# Patient Record
Sex: Male | Born: 1960
Health system: Southern US, Community
[De-identification: ages and names within clinical notes are randomized; demographics above are authoritative.]

## PROBLEM LIST (undated history)

## (undated) DIAGNOSIS — R519 Headache, unspecified: Secondary | ICD-10-CM

## (undated) DIAGNOSIS — I10 Essential (primary) hypertension: Secondary | ICD-10-CM

## (undated) DIAGNOSIS — E785 Hyperlipidemia, unspecified: Secondary | ICD-10-CM

## (undated) DIAGNOSIS — Z9989 Dependence on other enabling machines and devices: Secondary | ICD-10-CM

## (undated) DIAGNOSIS — G4733 Obstructive sleep apnea (adult) (pediatric): Secondary | ICD-10-CM

## (undated) DIAGNOSIS — K469 Unspecified abdominal hernia without obstruction or gangrene: Secondary | ICD-10-CM

## (undated) DIAGNOSIS — J45909 Unspecified asthma, uncomplicated: Secondary | ICD-10-CM

## (undated) DIAGNOSIS — R0600 Dyspnea, unspecified: Secondary | ICD-10-CM

## (undated) DIAGNOSIS — E669 Obesity, unspecified: Secondary | ICD-10-CM

## (undated) DIAGNOSIS — G5601 Carpal tunnel syndrome, right upper limb: Secondary | ICD-10-CM

## (undated) DIAGNOSIS — J189 Pneumonia, unspecified organism: Secondary | ICD-10-CM

## (undated) DIAGNOSIS — Z973 Presence of spectacles and contact lenses: Secondary | ICD-10-CM

## (undated) HISTORY — DX: Obstructive sleep apnea (adult) (pediatric): G47.33

## (undated) HISTORY — DX: Unspecified abdominal hernia without obstruction or gangrene: K46.9

## (undated) HISTORY — DX: Hyperlipidemia, unspecified: E78.5

## (undated) HISTORY — DX: Dependence on other enabling machines and devices: Z99.89

## (undated) HISTORY — DX: Obesity, unspecified: E66.9

## (undated) HISTORY — PX: WISDOM TOOTH EXTRACTION: SHX21

## (undated) HISTORY — DX: Pneumonia, unspecified organism: J18.9

## (undated) HISTORY — DX: Carpal tunnel syndrome, right upper limb: G56.01

## (undated) HISTORY — PX: EYE SURGERY: SHX253

---

## 1998-10-27 ENCOUNTER — Ambulatory Visit (HOSPITAL_COMMUNITY): Admission: RE | Admit: 1998-10-27 | Discharge: 1998-10-27 | Payer: Self-pay | Admitting: Internal Medicine

## 1998-12-05 ENCOUNTER — Ambulatory Visit (HOSPITAL_COMMUNITY): Admission: RE | Admit: 1998-12-05 | Discharge: 1998-12-05 | Payer: Self-pay | Admitting: Internal Medicine

## 1999-10-11 ENCOUNTER — Emergency Department (HOSPITAL_COMMUNITY): Admission: EM | Admit: 1999-10-11 | Discharge: 1999-10-11 | Payer: Self-pay | Admitting: *Deleted

## 1999-10-19 ENCOUNTER — Ambulatory Visit (HOSPITAL_COMMUNITY): Admission: RE | Admit: 1999-10-19 | Discharge: 1999-10-19 | Payer: Self-pay | Admitting: Surgery

## 2001-08-30 ENCOUNTER — Inpatient Hospital Stay (HOSPITAL_COMMUNITY): Admission: EM | Admit: 2001-08-30 | Discharge: 2001-09-01 | Payer: Self-pay | Admitting: Emergency Medicine

## 2002-11-19 HISTORY — PX: HERNIA REPAIR: SHX51

## 2003-11-20 HISTORY — PX: APPENDECTOMY: SHX54

## 2005-05-29 ENCOUNTER — Emergency Department (HOSPITAL_COMMUNITY): Admission: EM | Admit: 2005-05-29 | Discharge: 2005-05-30 | Payer: Self-pay | Admitting: Emergency Medicine

## 2006-04-26 ENCOUNTER — Emergency Department (HOSPITAL_COMMUNITY): Admission: EM | Admit: 2006-04-26 | Discharge: 2006-04-26 | Payer: Self-pay | Admitting: Family Medicine

## 2012-08-24 ENCOUNTER — Emergency Department (HOSPITAL_COMMUNITY)
Admission: EM | Admit: 2012-08-24 | Discharge: 2012-08-24 | Disposition: A | Discharge status: Home / Self Care | Payer: 59 | Source: Home / Self Care | Attending: Emergency Medicine | Admitting: Emergency Medicine

## 2012-08-24 ENCOUNTER — Encounter (HOSPITAL_COMMUNITY): Payer: Self-pay

## 2012-08-24 DIAGNOSIS — J0100 Acute maxillary sinusitis, unspecified: Secondary | ICD-10-CM

## 2012-08-24 HISTORY — DX: Essential (primary) hypertension: I10

## 2012-08-24 MED ORDER — FEXOFENADINE-PSEUDOEPHED ER 60-120 MG PO TB12: 1.0000 | ORAL_TABLET | Freq: Two times a day (BID) | ORAL | Status: AC

## 2012-08-24 MED ORDER — FEXOFENADINE-PSEUDOEPHED ER 60-120 MG PO TB12: 1.0000 | ORAL_TABLET | Freq: Two times a day (BID) | ORAL | Status: DC

## 2012-08-24 MED ORDER — AMOXICILLIN-POT CLAVULANATE 500-125 MG PO TABS: 1.0000 | ORAL_TABLET | Freq: Three times a day (TID) | ORAL | Status: DC

## 2012-08-24 NOTE — ED Provider Notes (Signed)
History     CSN: [id number]  Arrival date & time 08/24/12  0945   First MD Initiated Contact with Patient 08/24/12 [phone]      Chief Complaint  Patient presents with  . Facial Pain    (Consider location/radiation/quality/duration/timing/severity/associated sxs/prior treatment) HPI Comments: Patient presents urgent care this won't complaining of worsening sinus pressure to both maxillary regions. For approximately 6-7 days. He describes postnasal dripping and ear discomfort mainly on the right ear. He's been having a congested nose been trying with some Allegra from his wife with no improvement. Patient denies any fevers, cough body aches, or shortness of breath. Describes that at night dripping behind his throat wakes him up. He has had sinus infections before usually don't last as long.  The history is provided by the patient.    Past Medical History  Diagnosis Date  . Hypertension     History reviewed. No pertinent past surgical history.  No family history on file.  History  Substance Use Topics  . Smoking status: Not on file  . Smokeless tobacco: Not on file  . Alcohol Use: No      Review of Systems  Constitutional: Negative for fever, chills, activity change and appetite change.  HENT: Positive for congestion, sore throat, rhinorrhea, postnasal drip and sinus pressure. Negative for hearing loss, ear pain, sneezing, trouble swallowing, neck pain and voice change.   Respiratory: Negative for cough and shortness of breath.   Cardiovascular: Negative for chest pain.  Gastrointestinal: Negative for abdominal pain.  Genitourinary: Negative for difficulty urinating.  Skin: Negative for rash.  Neurological: Negative for dizziness, numbness and headaches.    Allergies  Review of patient's allergies indicates no known allergies.  Home Medications   Current Outpatient Rx  Name Route Sig Dispense Refill  . LISINOPRIL 20 MG PO TABS Oral Take 20 mg by mouth daily.    .  AMOXICILLIN-POT CLAVULANATE 500-125 MG PO TABS Oral Take 1 tablet (500 mg total) by mouth every 8 (eight) hours. 30 tablet 0  . FEXOFENADINE-PSEUDOEPHED ER 60-120 MG PO TB12 Oral Take 1 tablet by mouth every 12 (twelve) hours. 15 tablet 0    BP 140/90  Pulse 77  Temp 99.1 F (37.3 C) (Oral)  Resp 19  SpO2 93%  Physical Exam  Nursing note and vitals reviewed. Constitutional: He appears well-developed and well-nourished.  HENT:  Head: Normocephalic.  Right Ear: Tympanic membrane normal.  Left Ear: Tympanic membrane normal.  Nose: Right sinus exhibits maxillary sinus tenderness. Left sinus exhibits maxillary sinus tenderness.  Mouth/Throat: Uvula is midline. Posterior oropharyngeal erythema present. No oropharyngeal exudate.  Eyes: Conjunctivae normal are normal.  Neck: Neck supple. No JVD present.  Cardiovascular: Normal rate.   No murmur heard. Pulmonary/Chest: Effort normal and breath sounds normal.  Lymphadenopathy:    He has no cervical adenopathy.  Skin: No erythema.    ED Course  Procedures (including critical care time)  Labs Reviewed - No data to display No results found.   1. Sinusitis, acute maxillary       MDM  Maxillary sinusitis worsening. Have encouraged patient to take Allegra-D for 7 days (to monitor his blood pressure closely) increasing to stop Allegra-D and stick to plain Allegra. Also encouraged to start with Augmentin. And other measures to keep his upper airways hydrated. To increase his oral intake of fluids along with a humidifier use at night. Patient agrees with treatment plan followup care as necessary.        Freada Bergeron  Diana Davenport, MD 08/24/12 1026

## 2012-08-24 NOTE — ED Notes (Signed)
Sinus pain , pressure x 5 days

## 2012-11-01 ENCOUNTER — Emergency Department (HOSPITAL_COMMUNITY)
Admission: EM | Admit: 2012-11-01 | Discharge: 2012-11-01 | Disposition: A | Discharge status: Nursing Home (Includes ICF) | Payer: 59 | Source: Home / Self Care | Attending: Family Medicine | Admitting: Family Medicine

## 2012-11-01 ENCOUNTER — Emergency Department (INDEPENDENT_AMBULATORY_CARE_PROVIDER_SITE_OTHER): Payer: 59

## 2012-11-01 ENCOUNTER — Encounter (HOSPITAL_COMMUNITY): Payer: Self-pay | Admitting: Emergency Medicine

## 2012-11-01 ENCOUNTER — Encounter (HOSPITAL_COMMUNITY): Payer: Self-pay | Admitting: *Deleted

## 2012-11-01 ENCOUNTER — Emergency Department (HOSPITAL_COMMUNITY)
Admission: EM | Admit: 2012-11-01 | Discharge: 2012-11-01 | Disposition: A | Discharge status: Home Health | Payer: 59 | Attending: Emergency Medicine | Admitting: Emergency Medicine

## 2012-11-01 DIAGNOSIS — I10 Essential (primary) hypertension: Secondary | ICD-10-CM | POA: Insufficient documentation

## 2012-11-01 DIAGNOSIS — R0602 Shortness of breath: Secondary | ICD-10-CM

## 2012-11-01 DIAGNOSIS — R079 Chest pain, unspecified: Secondary | ICD-10-CM

## 2012-11-01 DIAGNOSIS — Z79899 Other long term (current) drug therapy: Secondary | ICD-10-CM | POA: Insufficient documentation

## 2012-11-01 DIAGNOSIS — J159 Unspecified bacterial pneumonia: Secondary | ICD-10-CM | POA: Insufficient documentation

## 2012-11-01 DIAGNOSIS — R062 Wheezing: Secondary | ICD-10-CM | POA: Insufficient documentation

## 2012-11-01 DIAGNOSIS — J189 Pneumonia, unspecified organism: Secondary | ICD-10-CM

## 2012-11-01 MED ORDER — MOXIFLOXACIN HCL 400 MG PO TABS: 400.0000 mg | ORAL_TABLET | Freq: Every day | ORAL | Status: DC

## 2012-11-01 MED ORDER — ALBUTEROL SULFATE HFA 108 (90 BASE) MCG/ACT IN AERS
2.0000 | INHALATION_SPRAY | RESPIRATORY_TRACT | Status: DC | PRN
  Administered 2012-11-01: 2 via RESPIRATORY_TRACT
  Filled 2012-11-01: qty 6.7

## 2012-11-01 NOTE — ED Notes (Signed)
Pt sent here from [facility]. Having productive cough and wheezing x 3 weeks, has been taking amoxicillin for sinus infection. Xray showed pneumonia and ucc reported possible ekg changes, transported here by [facility].

## 2012-11-01 NOTE — ED Notes (Signed)
Pt discharged to home with family. NAD.  

## 2012-11-01 NOTE — ED Notes (Signed)
Transport via [facility] arranged

## 2012-11-01 NOTE — ED Notes (Addendum)
Pt c/o productive cough with yellow sputum and wheezing x 3 wks. Pt states that he has felt feverish but never checked temp and some feeling of fatigue. Recent dx of sinus infection is currently on antibiotic. Pt denies n/v/d  Expiratory wheeze is noted.

## 2012-11-01 NOTE — ED Provider Notes (Signed)
History     CSN: [id number]  Arrival date & time 11/01/12  1336   First MD Initiated Contact with Patient 11/01/12 1517      Chief Complaint  Patient presents with  . Cough  . Pneumonia    (Consider location/radiation/quality/duration/timing/severity/associated sxs/prior treatment) HPI Pt with recent history of persistent cough for the last several weeks, has had two different courses of antibiotics with some relief but then returns. He has had some soreness in his chest related to cough but none otherwise. He reports a single episode of dyspnea after running up the stairs at [facility] resolved after coughing up some phlegm. He was seen at [facility] and had CXR showing a pneumonia however he was sent to the ED for evaluation of an 'abnormal EKG'.   Past Medical History  Diagnosis Date  . Hypertension     History reviewed. No pertinent past surgical history.  Family History  Problem Relation Age of Onset  . Diabetes Other   . Hypertension Other   . Thyroid disease Other     History  Substance Use Topics  . Smoking status: Never Smoker   . Smokeless tobacco: Not on file  . Alcohol Use: No      Review of Systems All other systems reviewed and are negative except as noted in HPI.   Allergies  Review of patient's allergies indicates no known allergies.  Home Medications   Current Outpatient Rx  Name  Route  Sig  Dispense  Refill  . ASPIRIN-ACETAMINOPHEN 500-325 MG PO PACK   Oral   Take 1 packet by mouth every 6 (six) hours as needed. For pain         . CEFDINIR 300 MG PO CAPS   Oral   Take 300 mg by mouth 2 (two) times daily.         . B-12 PO   Oral   Take 1 tablet by mouth daily.         . IBUPROFEN 200 MG PO TABS   Oral   Take 400 mg by mouth every 6 (six) hours as needed. For pain         . LOSARTAN POTASSIUM-HCTZ 50-12.5 MG PO TABS   Oral   Take 1 tablet by mouth daily.         . MULTI-VITAMIN/MINERALS PO TABS   Oral   Take 1 tablet by mouth  daily.           BP 136/85  Pulse 88  Temp 98 F (36.7 C) (Oral)  Resp 20  SpO2 98%  Physical Exam  Nursing note and vitals reviewed. Constitutional: He is oriented to person, place, and time. He appears well-developed and well-nourished.  HENT:  Head: Normocephalic and atraumatic.  Eyes: EOM are normal. Pupils are equal, round, and reactive to light.  Neck: Normal range of motion. Neck supple.  Cardiovascular: Normal rate, normal heart sounds and intact distal pulses.   Pulmonary/Chest: Effort normal and breath sounds normal. He exhibits tenderness.  Abdominal: Bowel sounds are normal. He exhibits no distension. There is no tenderness.  Musculoskeletal: Normal range of motion. He exhibits no edema and no tenderness.  Neurological: He is alert and oriented to person, place, and time. He has normal strength. No cranial nerve deficit or sensory deficit.  Skin: Skin is warm and dry. No rash noted.  Psychiatric: He has a normal mood and affect.    ED Course  Procedures (including critical care time)  Labs Reviewed -  No data to display Dg Chest 2 View  11/01/2012  *RADIOLOGY REPORT*  Clinical Data: Productive cough.  Shortness of breath.  CHEST - 2 VIEW  Comparison: None.  Findings: Mild infiltrate is seen in the posterior left lower lobe, consistent with pneumonia.  Right lung is clear.  No evidence of pleural effusion.  Heart size is normal.  No hilar or mediastinal masses are identified.  IMPRESSION: Posterior left lower lobe infiltrate, consistent with pneumonia. Post-treatment  radiographic followup recommended to confirm resolution.   Original Report Authenticated By: Myles Rosenthal, M.D.      No diagnosis found.    MDM   Date: 11/01/2012  Rate: 82  Rhythm: normal sinus rhythm  QRS Axis: normal  Intervals: normal  ST/T Wave abnormalities: normal  Conduction Disutrbances:none  Narrative Interpretation:   Old EKG Reviewed: unchanged  EKG changes mentioned in the note  from [facility] are not apparent on the ED EKG which is normal. Pt's symptoms are not concerning for ACS or PE. Will d/c Omnicef and discharge with Avelox Rx and albuterol HFA         Charles B. Bernette Mayers, MD 11/01/12 1537

## 2012-11-01 NOTE — ED Provider Notes (Signed)
History     CSN: [id number]  Arrival date & time 11/01/12  1116   First MD Initiated Contact with Patient 11/01/12 1146      Chief Complaint  Patient presents with  . Cough    productive cough. wheezing    (Consider location/radiation/quality/duration/timing/severity/associated sxs/prior treatment) HPI Comments: 51 year old male with history of obesity and hypertension. Here complaining of central chest discomfort -like pressure for 2 days. Symptoms associated with productive cough with white yellow sputum, nasal congestion, general malaise and shortness of breath and wheezing progressively worse during the last 3 weeks. He has been on antibiotics twice for upper respiratory infection and is currently on Augmentin day #3. Patient also reports paroxysmal nocturnal dyspnea and shortness of breath with going up few flair of stairs dyspnea for him during the last week. Denies fever. No nausea vomiting or diarrhea. No headaches or dizziness.   Past Medical History  Diagnosis Date  . Hypertension     History reviewed. No pertinent past surgical history.  Family History  Problem Relation Age of Onset  . Diabetes Other   . Hypertension Other   . Thyroid disease Other     History  Substance Use Topics  . Smoking status: Never Smoker   . Smokeless tobacco: Not on file  . Alcohol Use: No      Review of Systems  Constitutional: Positive for fatigue. Negative for fever and chills.  HENT: Positive for congestion.   Respiratory: Positive for cough, chest tightness, shortness of breath and wheezing.   Cardiovascular: Positive for chest pain. Negative for leg swelling.  Gastrointestinal: Negative for nausea, vomiting, abdominal pain and diarrhea.  Musculoskeletal: Negative for back pain.  Skin: Negative for rash.  Neurological: Negative for dizziness and headaches.    Allergies  Review of patient's allergies indicates no known allergies.  Home Medications   Current  Outpatient Rx  Name  Route  Sig  Dispense  Refill  . AMOXICILLIN-POT CLAVULANATE 500-125 MG PO TABS   Oral   Take 1 tablet (500 mg total) by mouth every 8 (eight) hours.   30 tablet   0   . LISINOPRIL 20 MG PO TABS   Oral   Take 20 mg by mouth daily.           BP 153/105  Pulse 89  Temp 99 F (37.2 C) (Oral)  Resp 19  SpO2 96%  Physical Exam  Nursing note and vitals reviewed. Constitutional: He is oriented to person, place, and time. He appears well-developed and well-nourished.       Ill appearing.  HENT:  Head: Normocephalic and atraumatic.       Nasal Congestion with erythema of nasal turbinates, no rhinorrhea. Pharyngeal erythema no exudates. No uvula deviation. No trismus. TM's with increased vascular markings and some dullness more on the left side, no swelling or bulging   Eyes: Conjunctivae normal are normal. Pupils are equal, round, and reactive to light. Right eye exhibits no discharge. Left eye exhibits no discharge. No scleral icterus.  Neck: Neck supple. No JVD present. No thyromegaly present.  Cardiovascular: Normal rate, regular rhythm and normal heart sounds.  Exam reveals no gallop and no friction rub.        Trace nonpitting bilateral lower extremity edema  Pulmonary/Chest:       Decreased breath sounds in bilateral bases worse on the left side also associated with rales. No tachypnea. No retractions. No rhonchi or wheezing.  Lymphadenopathy:    He has no cervical  adenopathy.  Neurological: He is alert and oriented to person, place, and time.  Skin: No rash noted. He is not diaphoretic.    ED Course  Procedures (including critical care time)  Labs Reviewed - No data to display Dg Chest 2 View  11/01/2012  *RADIOLOGY REPORT*  Clinical Data: Productive cough.  Shortness of breath.  CHEST - 2 VIEW  Comparison: None.  Findings: Mild infiltrate is seen in the posterior left lower lobe, consistent with pneumonia.  Right lung is clear.  No evidence of  pleural effusion.  Heart size is normal.  No hilar or mediastinal masses are identified.  IMPRESSION: Posterior left lower lobe infiltrate, consistent with pneumonia. Post-treatment  radiographic followup recommended to confirm resolution.   Original Report Authenticated By: Myles Rosenthal, M.D.      1. Left lower lobe pneumonia   2. Chest pain   3. Shortness of breath     EKG: With sinus rhythm and ventricular rate 73 beats per minutes. Impress ST depression in V1 with inverted T-waves in V1 V2 or V3.     MDM  51 year old male with history of obesity and hypertension. Here complaining of central chest pressure type discomfort for 2 days. Has recently been on 2 different antibiotics for upper respiratory infection. Reports paroxysmal nocturnal dyspnea, and worsening shortness of breath on going up stairs. On exam: Decrease breath sounds and bilateral crackles worse on the left side. O2 sat 93%. Nonpitting trace edema. X-rays shows left Lower lobe pneumonia. Patient EKG findings and clinical findings and history concerning for acute coronary artery disease or developing CHF possibly concomitant with pneumonia. Decided to transfer to the emergency department for further evaluation and management patient has been transferred via [facility].         Sharin Grave, MD 11/01/12 1324

## 2012-12-10 ENCOUNTER — Encounter: Payer: Self-pay | Admitting: *Deleted

## 2012-12-11 ENCOUNTER — Encounter: Payer: Self-pay | Admitting: Internal Medicine

## 2012-12-11 ENCOUNTER — Ambulatory Visit (INDEPENDENT_AMBULATORY_CARE_PROVIDER_SITE_OTHER): Payer: 59 | Admitting: Internal Medicine

## 2012-12-11 VITALS — BP 130/86 | HR 75 | Temp 98.6°F | Ht 72.0 in | Wt 343.2 lb

## 2012-12-11 DIAGNOSIS — R0602 Shortness of breath: Secondary | ICD-10-CM

## 2012-12-11 DIAGNOSIS — R0609 Other forms of dyspnea: Secondary | ICD-10-CM

## 2012-12-11 DIAGNOSIS — R0683 Snoring: Secondary | ICD-10-CM

## 2012-12-11 DIAGNOSIS — R0600 Dyspnea, unspecified: Secondary | ICD-10-CM

## 2012-12-11 DIAGNOSIS — Z6841 Body Mass Index (BMI) 40.0 and over, adult: Secondary | ICD-10-CM

## 2012-12-11 NOTE — Assessment & Plan Note (Signed)
  Weight Mgmt  - Please look at [facility] low glycemic lipid diet sheet - Please take to foods in the left lane. Which is to eat only healthy foods and avoid unhealthy foods. Learning what is healthy and what is unhealthy is key  - Principal is to avoid foods in the middle and the right lines which are basically sugary fruits, canned beans, all types of soda including diet soda, sugary foods, all bread and all pasta. Load up heavily on green vegetables and lean meat avoiding fried foods at all times - Discipline is key now and forever. Will power is the weakest when you're tired of hungry especially at night. Keep yourself full by snacking on healthy foods - Ideal snacks or nuts, hummus, weight protein powder, low-fat plain yogurt or light yogurt but in limited quantities, 1-2 servings of low glycemic fruits

## 2012-12-11 NOTE — Assessment & Plan Note (Signed)
Refer sleep medicine

## 2012-12-11 NOTE — Assessment & Plan Note (Signed)
Shortness of breath Shortness of breath was likely due to pneumonia and related deconditioning after that. With exercise you have improved and back to normal In the absence of any respiratory symptoms there is no indication to do any pulmonary function test especially to walk test on room air is normal However if he were to develop any respiratory symptoms including wheezing chest tightness or cough or shortness of breath you would need evaluation

## 2012-12-11 NOTE — Patient Instructions (Addendum)
Shortness of breath Shortness of breath was likely due to pneumonia and related deconditioning after that. With exercise you have improved and back to normal In the absence of any respiratory symptoms there is no indication to do any pulmonary function test especially to walk test on room air is normal However if he were to develop any respiratory symptoms including wheezing chest tightness or cough or shortness of breath you would need evaluation  Weight Mgmt  - Please look at [facility] low glycemic lipid diet sheet - Please take to foods in the left lane. Which is to eat only healthy foods and avoid unhealthy foods. Learning what is healthy and what is unhealthy is key  - Principal is to avoid foods in the middle and the right lines which are basically sugary fruits, canned beans, all types of soda including diet soda, sugary foods, all bread and all pasta. Load up heavily on green vegetables and lean meat avoiding fried foods at all times - Discipline is key now and forever. Will power is the weakest when you're tired of hungry especially at night. Keep yourself full by snacking on healthy foods - Ideal snacks or nuts, hummus, weight protein powder, low-fat plain yogurt or light yogurt but in limited quantities, 1-2 servings of low glycemic fruits  Sleep evaluation  - Please make an appointment to see one of the sleep specialist in our office  Followup in - Sleep Dr. Marland Kitchen No further followup with me unless respiratory problems arise

## 2012-12-11 NOTE — Progress Notes (Signed)
Subjective:    Patient ID: Jake Martin, male    DOB: 17-Oct-1961, 52 y.o.   MRN: [id number]  HPI PCP is Hoyle Sauer, MD  Body mass index is 46.55 kg/(m^2). History  Smoking status  . Never Smoker   Smokeless tobacco  . Not on file     IOV 12/11/2012   This is a 52 year old morbidly obese, nonsmoking male. He currently weighs around 330 pounds. A few years ago he weighed less than 300 pounds but 18 months ago weight 360 pounds. Since the last 18 months he has been working on a treadmill and this gradually lost 30 pounds of weight. For his routine treadmill exercise and daily life his never had any respiratory complaints or any chest complaints whatsoever. Then in the fall and early winter of 2013 he picked up some congestion in his left chest with symptoms of cough and yellow sputum, focal wheezing but no fever. This resulted ultimately in an emergency room visit on 11/01/2012 when chest x-ray showed left lower lobe infiltrates. He was discharged on sunken and antibiotic not otherwise specified. Of note I personally review the section confirmed the findings. After that if began to feel dramatically better and his respiratory symptoms resolved. However all the symptoms resulted in him not doing his routine treadmill exercise. Then in January 2014 because he was feeling better he started back on treadmill and started noticing dyspnea with exertion and fatigue. He reported this to his primary care physician who did a repeat chest x-ray and per patient and review of primary-care physician notes the chest x-ray showed resolution of pneumonia [I do not have personally review of this chest x-ray image]. Given his morbid obesity and rest recent and his primary care physician was concerned enough that he refer patient to t pulmonary. She was also given samples of dulera he used for a few days and did find improvement with symptoms. After that he stopped using the inhaler but never had any  recurrence of symptoms. In the time between making the appointment and today he continues to do well without any symptoms and feels back to baseline. He does not feel that he needs a pulmonary function test. Walking him in the office 185 feet x3 laps he did not desaturate or get tachycardic. From a respiratory and cardiac standpoint he is completely asymptomatic  His main concern is  1) sleep apnea. He wants to be evaluated for this 2) morbid obesity: He is aware of all the risk factors that this brings. He wants to lose weight. We did discuss some dietary strategies of low bad-carbohydrate, high fiber rich food with natural vegetables and lean meat diet advantages    CXR 11/01/12  - RADIOLOGY REPORT*  Clinical Data: Productive cough. Shortness of breath.  CHEST - 2 VIEW  Comparison: None.  Findings: Mild infiltrate is seen in the posterior left lower lobe,  consistent with pneumonia. Right lung is clear. No evidence of  pleural effusion. Heart size is normal. No hilar or mediastinal  masses are identified.  IMPRESSION:  Posterior left lower lobe infiltrate, consistent with pneumonia.  Post-treatment radiographic followup recommended to confirm  resolution.  Original Report Authenticated By: Myles Rosenthal, M.D.    Past Medical History  Diagnosis Date  . Hypertension   . Hernia   . Obesity   . Pneumonia      Family History  Problem Relation Age of Onset  . Diabetes Other   . Hypertension Other   . Thyroid disease  Other   . COPD Father      History   Social History  . Marital Status: Married    Spouse Name: N/A    Number of Children: N/A  . Years of Education: N/A   Occupational History  . Not on file.   Social History Main Topics  . Smoking status: Never Smoker   . Smokeless tobacco: Not on file  . Alcohol Use: No  . Drug Use: No  . Sexually Active:    Other Topics Concern  . Not on file   Social History Narrative  . No narrative on file     No Known  Allergies   Outpatient Prescriptions Prior to Visit  Medication Sig Dispense Refill  . Aspirin-Acetaminophen (GOODY BODY PAIN) 500-325 MG PACK Take 1 packet by mouth every 6 (six) hours as needed. For pain      . Cyanocobalamin (B-12 PO) Take 1 tablet by mouth daily.      Marland Kitchen ibuprofen (ADVIL,MOTRIN) 200 MG tablet Take 400 mg by mouth every 6 (six) hours as needed. For pain      . losartan-hydrochlorothiazide (HYZAAR) 50-12.5 MG per tablet Take 1 tablet by mouth daily.      . Multiple Vitamins-Minerals (MULTIVITAMIN WITH MINERALS) tablet Take 1 tablet by mouth daily.      . [DISCONTINUED] cefdinir (OMNICEF) 300 MG capsule Take 300 mg by mouth 2 (two) times daily.      . [DISCONTINUED] moxifloxacin (AVELOX) 400 MG tablet Take 1 tablet (400 mg total) by mouth daily.  7 tablet  0   Last reviewed on 12/11/2012  4:26 PM by Darrell Jewel, CMA     Review of Systems  Constitutional: Negative for fever and unexpected weight change.  HENT: Negative for ear pain, nosebleeds, congestion, sore throat, rhinorrhea, sneezing, trouble swallowing, dental problem, postnasal drip and sinus pressure.   Eyes: Negative for redness and itching.  Respiratory: Positive for shortness of breath. Negative for cough, chest tightness and wheezing.   Cardiovascular: Negative for palpitations and leg swelling.  Gastrointestinal: Negative for nausea and vomiting.  Genitourinary: Negative for dysuria.  Musculoskeletal: Negative for joint swelling.  Skin: Negative for rash.  Neurological: Negative for headaches.  Hematological: Does not bruise/bleed easily.  Psychiatric/Behavioral: Negative for dysphoric mood. The patient is not nervous/anxious.        Objective:   Physical Exam  Nursing note and vitals reviewed. Constitutional: He is oriented to person, place, and time. He appears well-developed and well-nourished. No distress.       Body mass index is 46.55 kg/(m^2).   HENT:  Head: Normocephalic and  atraumatic.  Right Ear: External ear normal.  Left Ear: External ear normal.  Mouth/Throat: Oropharynx is clear and moist. No oropharyngeal exudate.  Eyes: Conjunctivae normal and EOM are normal. Pupils are equal, round, and reactive to light. Right eye exhibits no discharge. Left eye exhibits no discharge. No scleral icterus.  Neck: Normal range of motion. Neck supple. No JVD present. No tracheal deviation present. No thyromegaly present.  Cardiovascular: Normal rate, regular rhythm and intact distal pulses.  Exam reveals no gallop and no friction rub.   No murmur heard. Pulmonary/Chest: Effort normal and breath sounds normal. No respiratory distress. He has no wheezes. He has no rales. He exhibits no tenderness.  Abdominal: Soft. Bowel sounds are normal. He exhibits no distension and no mass. There is no tenderness. There is no rebound and no guarding.  Musculoskeletal: Normal range of motion. He exhibits no  edema and no tenderness.  Lymphadenopathy:    He has no cervical adenopathy.  Neurological: He is alert and oriented to person, place, and time. He has normal reflexes. No cranial nerve deficit. Coordination normal.  Skin: Skin is warm and dry. No rash noted. He is not diaphoretic. No erythema. No pallor.  Psychiatric: He has a normal mood and affect. His behavior is normal. Judgment and thought content normal.          Assessment & Plan:

## 2012-12-30 ENCOUNTER — Institutional Professional Consult (permissible substitution): Payer: 59 | Admitting: Pulmonary Disease

## 2014-09-02 ENCOUNTER — Encounter: Payer: Self-pay | Admitting: *Deleted

## 2014-09-09 ENCOUNTER — Ambulatory Visit (INDEPENDENT_AMBULATORY_CARE_PROVIDER_SITE_OTHER): Payer: 59 | Admitting: Neurology

## 2014-09-09 ENCOUNTER — Encounter: Payer: Self-pay | Admitting: Neurology

## 2014-09-09 VITALS — BP 153/97 | HR 62 | Temp 97.1°F | Ht 72.0 in | Wt 330.0 lb

## 2014-09-09 DIAGNOSIS — R351 Nocturia: Secondary | ICD-10-CM

## 2014-09-09 DIAGNOSIS — R0683 Snoring: Secondary | ICD-10-CM

## 2014-09-09 DIAGNOSIS — G4733 Obstructive sleep apnea (adult) (pediatric): Secondary | ICD-10-CM

## 2014-09-09 DIAGNOSIS — G4719 Other hypersomnia: Secondary | ICD-10-CM

## 2014-09-09 NOTE — Patient Instructions (Signed)

## 2014-09-09 NOTE — Progress Notes (Signed)
Subjective:    Patient ID: Jake Martin is a 53 y.o. male.  HPI    Huston Foley, MD, PhD [facility] [address], [address] [address] [place], [place] [address]  Dear Dr. Felipa Eth,  I saw your patient, Jake Martin, upon your kind request in my neurologic clinic today for initial consultation of his sleep disorder, in particular, concern for underlying obstructive sleep apnea. The patient is unaccompanied today. As you know, Jake Martin is a 53 year old right-handed gentleman with an underlying medical history of morbid obesity, umbilical hernia, hypertension, carpal tunnel syndrome, hyperlipidemia and pneumonia in 2013, who reports snoring and witnessed apneas per wife as well as daytime somnolence. His ESS is 10-12. He endorses stress and recent weight gain. He does not nap. He denies morning headaches or RLS symptoms. Snoring is loud. He has to go to the bathroom once per night, usually when his dog wakes him up in the early morning hours. He is not known to kick in his sleep. He denies nocturnal acid reflux symptoms. He does have nasal congestion which seems to be seasonal and occasionally has postnasal drip.  He goes to bed around midnight and typically falls asleep okay. He wakes up once per night on average and goes to the bathroom once per night on average. He estimates that he gets about 5-6 hours of sleep per night. He does wake up rested but has daytime tiredness as the day progresses. He is a Surveyor, [employer]. Rarely will he turned on his sides. He does not sleep on his back. He drinks a lot of caffeine, approximately 10 caffeine containing beverages per day including 6 cups of coffee and unsweet tea. He does not drink very much water at all. He may have a family history of obstructive sleep apnea in his mother.  He denies cataplexy, sleep paralysis, hypnagogic or hypnopompic hallucinations, or sleep attacks. He does not report any vivid dreams,  nightmares, dream enactments, or parasomnias, such as sleep talking or sleep walking. The patient has not had a sleep study or a home sleep test.  He does not smoke. He drinks alcohol approximately 3-5 glasses of wine per week, sometimes in the form of beer in the summer time.  His Past Medical History Is Significant For: Past Medical History  Diagnosis Date  . Hypertension   . Hernia   . Obesity   . Pneumonia   . Hyperlipidemia   . Umbilical hernia   . Carpal tunnel syndrome, right     His Past Surgical History Is Significant For: Past Surgical History  Procedure Laterality Date  . Hernia repair  2004  . Eye surgery  [phone]  . Appendectomy  2005    His Family History Is Significant For: Family History  Problem Relation Age of Onset  . Diabetes Other   . Thyroid disease Other   . Hypertension Sister   . COPD Father   . Hypertension Father   . Diabetes type II      GRANDFATHER  . Coronary artery disease      GRANDMOTHER  . Aneurysm Mother     BRAIN  . Diabetes Paternal Grandmother   . Diabetes Son     His Social History Is Significant For: History   Social History  . Marital Status: Married    Spouse Name: [name]    Number of Children: 2  . Years of Education: 15   Occupational History  .      [employer]   Social History Main Topics  . Smoking status: Never Smoker   . Smokeless tobacco: Never Used  . Alcohol Use: Yes     Comment: wine 3-5 glasses weekly  . Drug Use: No  . Sexual Activity: None   Other Topics Concern  . None   Social History Narrative   Right handed and consumes 3-5 cups of caffeine daily    His Allergies Are:  No Known Allergies:   His Current Medications Are:  Outpatient Encounter Prescriptions as of 09/09/2014  Medication Sig  . albuterol (PROVENTIL HFA;VENTOLIN HFA) 108 (90 BASE) MCG/ACT inhaler Inhale 2 puffs into the lungs every 4 (four) hours as needed for wheezing or shortness of breath (2 PUFFS EVERY 4 TO 6  HOURS AS NEEDED FOR COUGH).  . Aspirin-Acetaminophen (GOODY BODY PAIN) 500-325 MG PACK Take 1 packet by mouth every 6 (six) hours as needed. For pain  . Cyanocobalamin (B-12 PO) Take 1 tablet by mouth daily.  Marland Kitchen. ibuprofen (ADVIL,MOTRIN) 200 MG tablet Take 400 mg by mouth every 6 (six) hours as needed. For pain  . irbesartan-hydrochlorothiazide (AVALIDE) 150-12.5 MG per tablet Take 1 tablet by mouth daily.  . mometasone-formoterol (DULERA) 100-5 MCG/ACT AERO Inhale 2 puffs into the lungs 2 (two) times daily.  . Multiple Vitamins-Minerals (MULTIVITAMIN WITH MINERALS) tablet Take 1 tablet by mouth daily.  . pravastatin (PRAVACHOL) 40 MG tablet Take 40 mg by mouth daily.  . [DISCONTINUED] losartan-hydrochlorothiazide (HYZAAR) 50-12.5 MG per tablet Take 1 tablet by mouth daily.  :  Review of Systems:  Out of a complete 14 point review of systems, all are reviewed and negative with the exception of these symptoms as listed below:   Review of Systems  Constitutional:       Weight gain  Respiratory: Positive for cough and wheezing.        Snoring  Psychiatric/Behavioral:       Decreased energy, change in appetite    Objective:  Neurologic Exam  Physical Exam Physical Examination:   Filed Vitals:   09/09/14 1026  BP: 153/97  Pulse: 62  Temp: 97.1 F (36.2 C)    General Examination: The patient is a very pleasant 53 y.o. male in no acute distress. He appears well-developed and well-nourished and well groomed. He is obese.  HEENT: Normocephalic, atraumatic, pupils are equal, round and reactive to light and accommodation. Funduscopic exam is normal with sharp disc margins noted. Extraocular tracking is good without limitation to gaze excursion or nystagmus noted. Normal smooth pursuit is noted. Hearing is grossly intact. Tympanic membranes are clear bilaterally. Face is symmetric with normal facial animation and normal facial sensation. Speech is clear with no dysarthria noted. There is no  hypophonia. There is no lip, neck/head, jaw or voice tremor. Neck is supple with full range of passive and active motion. There are no carotid bruits on auscultation. Oropharynx exam reveals: mild mouth dryness, good dental hygiene and marked airway crowding, due to thick soft palate, redundant soft palate, larger uvula, thicker tongue, and tonsils are 2+ bilaterally. Mallampati is class III. Tongue protrudes centrally and palate elevates symmetrically. Neck size is 18.5 inches. He has a Absent overbite. Nasal inspection reveals no significant nasal mucosal bogginess or redness and no septal deviation.   Chest: Clear to auscultation without wheezing, rhonchi or crackles noted.  Heart: S1+S2+0, regular and normal without murmurs, rubs or gallops noted.   Abdomen: Soft, non-tender and non-distended with normal bowel sounds appreciated on auscultation.  Extremities:  There is no pitting edema in the distal lower extremities bilaterally. Pedal pulses are intact.  Skin: Warm and dry without trophic changes noted. There are no varicose veins, but he has spider veins and chronic appearing mild discoloration in the distal lower extremities bilaterally.  Musculoskeletal: exam reveals no obvious joint deformities, tenderness or joint swelling or erythema.   Neurologically:  Mental status: The patient is awake, alert and oriented in all 4 spheres. His immediate and remote memory, attention, language skills and fund of knowledge are appropriate. There is no evidence of aphasia, agnosia, apraxia or anomia. Speech is clear with normal prosody and enunciation. Thought process is linear. Mood is normal and affect is normal.  Cranial nerves II - XII are as described above under HEENT exam. In addition: shoulder shrug is normal with equal shoulder height noted. Motor exam: Normal bulk, strength and tone is noted. There is no drift, tremor or rebound. Romberg is negative. Reflexes are 2+ throughout. Babinski: Toes are  flexor bilaterally. Fine motor skills and coordination: intact with normal finger taps, normal hand movements, normal rapid alternating patting, normal foot taps and normal foot agility.  Cerebellar testing: No dysmetria or intention tremor on finger to nose testing. Heel to shin is unremarkable bilaterally. There is no truncal or gait ataxia.  Sensory exam: intact to light touch, pinprick, vibration, temperature sense in the upper and lower extremities.  Gait, station and balance: He stands easily. No veering to one side is noted. No leaning to one side is noted. Posture is age-appropriate and stance is narrow based. Gait shows normal stride length and normal pace. No problems turning are noted. He turns en bloc. Tandem walk is unremarkable.                Assessment and Plan:  In summary, Elinor ParkinsonLeonard M Bohnet is a very pleasant 53 y.o.-year old male with an underlying medical history of morbid obesity, umbilical hernia, hypertension, carpal tunnel syndrome, hyperlipidemia and pneumonia in 2013, whose history and physical exam are in keeping with obstructive sleep apnea (OSA). I had a long chat with the patient about my findings and the diagnosis of OSA, its prognosis and treatment options. We talked about medical treatments, surgical interventions and non-pharmacological approaches. I explained in particular the risks and ramifications of untreated moderate to severe OSA, especially with respect to developing cardiovascular disease down the Road, including congestive heart failure, difficult to treat hypertension, cardiac arrhythmias, or stroke. Even type 2 diabetes has, in part, been linked to untreated OSA. Symptoms of untreated OSA include daytime sleepiness, memory problems, mood irritability and mood disorder such as depression and anxiety, lack of energy, as well as recurrent headaches, especially morning headaches. We talked about trying to maintain a healthy lifestyle in general, as well as the  importance of weight control. I encouraged the patient to eat healthy, exercise daily and keep well hydrated, to keep a scheduled bedtime and wake time routine, to not skip any meals and eat healthy snacks in between meals. I advised the patient not to drive when feeling sleepy. I recommended the following at this time: sleep study with potential positive airway pressure titration. (We will score hypopneas at 3% and split the sleep study into diagnostic and treatment portion, if the estimated. 2 hour AHI is >15/h).   I explained the sleep test procedure to the patient and also outlined possible surgical and non-surgical treatment options of OSA, including the use of a custom-made dental device (which would require a  referral to a specialist dentist or oral surgeon), upper airway surgical options, such as pillar implants, radiofrequency surgery, tongue base surgery, and UPPP (which would involve a referral to an ENT surgeon). Rarely, jaw surgery such as mandibular advancement may be considered.  I also explained the CPAP treatment option to the patient, who indicated that  he  would be willing to try CPAP if the need arises. I explained the importance of being compliant with PAP treatment, not only for insurance purposes but primarily to improve His symptoms, and for the patient's long term health benefit, including to reduce His cardiovascular risks. I answered all  his  questions today and the patient was in agreement. I would like to see  him  back after the sleep study is completed and encouraged  him  to call with any interim questions, concerns, problems or updates.   Thank you very much for allowing me to participate in the care of this nice patient. If I can be of any further assistance to you please do not hesitate to call me at [phone].  Sincerely,   Huston Foley, MD, PhD

## 2014-10-07 ENCOUNTER — Ambulatory Visit (INDEPENDENT_AMBULATORY_CARE_PROVIDER_SITE_OTHER): Payer: 59 | Admitting: Neurology

## 2014-10-07 VITALS — BP 125/76

## 2014-10-07 DIAGNOSIS — G4733 Obstructive sleep apnea (adult) (pediatric): Secondary | ICD-10-CM

## 2014-10-07 NOTE — Sleep Study (Signed)
Please see the scanned sleep study interpretation located in the Procedure tab within the Chart Review section. 

## 2014-10-13 ENCOUNTER — Encounter: Payer: Self-pay | Admitting: Neurology

## 2014-10-13 ENCOUNTER — Telehealth: Payer: Self-pay | Admitting: Neurology

## 2014-10-13 DIAGNOSIS — G4733 Obstructive sleep apnea (adult) (pediatric): Secondary | ICD-10-CM

## 2014-10-13 NOTE — Telephone Encounter (Signed)
Please call and notify patient that the recent sleep study confirmed the diagnosis of OSA. He did very well with CPAP during the study with significant improvement of the respiratory events. Therefore, I would like start the patient on CPAP at home. I placed the order in the chart.   Arrange for CPAP set up at home through a DME company of patient's choice and fax/route report to PCP and referring MD (if other than PCP).   The patient will also need a follow up appointment with me in 6-8 weeks post set up that has to be scheduled; help the patient schedule this (in a follow-up slot).   Please re-enforce the importance of compliance with treatment and the need for us to monitor compliance data.   Once you have spoken to the patient and scheduled the return appointment, you may close this encounter, thanks,   Daunte Oestreich, MD, PhD [facility] ([facility]    

## 2014-10-13 NOTE — Telephone Encounter (Signed)
This patient was contacted with the results from his sleep study.  He is aware that he has been diagnosed with severe obstructive sleep apnea and will require treatment in the form of CPAP therapy.  He understands that during testing he did very well on CPAP and an order has been submitted to [facility] for setup.  Dr. Felipa EthAvva has been faxed a copy of his sleep study results and the patient will receive his report via mail.

## 2014-11-30 ENCOUNTER — Encounter: Payer: Self-pay | Admitting: Neurology

## 2015-02-04 ENCOUNTER — Encounter: Payer: Self-pay | Admitting: Neurology

## 2015-02-11 ENCOUNTER — Telehealth: Payer: Self-pay

## 2015-02-11 DIAGNOSIS — Z9989 Dependence on other enabling machines and devices: Principal | ICD-10-CM

## 2015-02-11 DIAGNOSIS — G4733 Obstructive sleep apnea (adult) (pediatric): Secondary | ICD-10-CM

## 2015-02-11 NOTE — Telephone Encounter (Signed)
Left message to call back  

## 2015-02-11 NOTE — Progress Notes (Signed)
Left message to call back  

## 2015-02-11 NOTE — Telephone Encounter (Signed)
cpap supplies reordered, pt would like to try nasal mask (with chinstrap). DME: [facility], please fax order

## 2015-02-11 NOTE — Progress Notes (Signed)
Quick Note:  I reviewed the patient's CPAP compliance data from 01/01/2015 through 01/30/2015 which is a total of 30 days during which time he used his machine every night with percent used days greater than 4 hours at 87%, indicating very good compliance with an average usage for all days of 5 hours and 25 minutes on a pressure of 8 cm with EPR of 1. Residual AHI acceptable at 4.4 per hour but leak at times high with the 95th percentile at 41.8 L/m. Please call patient to advise him to get in touch with his DME company, [facility] to see if they can help him with his air leak from the mask. He may need new supplies or a different type of mask if he is up for a mask renewal. He has an appointment with me on 03/03/2015 at 1 PM and please remind him of his appointment as well. Everything else looks good. Huston FoleySaima Garrie Elenes, MD, PhD [facility] ([facility]  ______

## 2015-02-11 NOTE — Telephone Encounter (Signed)
-----   Message from Saima Athar, MD sent at 02/11/2015  9:49 AM EDT ----- I reviewed the patient's CPAP compliance data from 01/01/2015 through 01/30/2015 which is a total of 30 days during which time he used his machine every night with percent used days greater than 4 hours at 87%, indicating very good compliance with an average usage for all days of 5 hours and 25 minutes on a pressure of 8 cm with EPR of 1. Residual AHI acceptable at 4.4 per hour but leak at times high with the 95th percentile at 41.8 L/m. Please call patient to advise him to get in touch with his DME company, [facility] to see if they can help him with his air leak from the mask. He may need new supplies or a different type of mask if he is up for a mask renewal. He has an appointment with me on 03/03/2015 at 1 PM and please remind him of his appointment as well. Everything else looks good. Saima Athar, MD, PhD [facility] ([facility]  

## 2015-02-11 NOTE — Telephone Encounter (Signed)
Talked to wife and she will relay message that we are faxing orders in .

## 2015-02-11 NOTE — Progress Notes (Signed)
Sent Dr. Frances FurbishAthar a message. Pt has a hard time with the face mask and would like to get a nose/chin strap mask.

## 2015-02-11 NOTE — Telephone Encounter (Signed)
-----   Message from Huston FoleySaima Athar, MD sent at 02/11/2015  9:49 AM EDT ----- I reviewed the patient's CPAP compliance data from 01/01/2015 through 01/30/2015 which is a total of 30 days during which time he used his machine every night with percent used days greater than 4 hours at 87%, indicating very good compliance with an average usage for all days of 5 hours and 25 minutes on a pressure of 8 cm with EPR of 1. Residual AHI acceptable at 4.4 per hour but leak at times high with the 95th percentile at 41.8 L/m. Please call patient to advise him to get in touch with his DME company, [facility] to see if they can help him with his air leak from the mask. He may need new supplies or a different type of mask if he is up for a mask renewal. He has an appointment with me on 03/03/2015 at 1 PM and please remind him of his appointment as well. Everything else looks good. Huston FoleySaima Athar, MD, PhD [facility]

## 2015-02-11 NOTE — Telephone Encounter (Signed)
I was able to talk with patient. He states that he just got a new mask but has a hard time sleeping on his back. He reports that with sleeping on his side and pillow placement, it causes leaks. Jake Martin wants to know if you would prescribe a nose mask/chin strap?

## 2015-02-14 ENCOUNTER — Telehealth: Payer: Self-pay

## 2015-02-14 NOTE — Telephone Encounter (Signed)
Talked to patient and he would like to try nasal cpap. Dr. Frances FurbishAthar approved and printed orders. I faxed orders in on Friday. Patient aware.

## 2015-02-14 NOTE — Telephone Encounter (Signed)
-----   Message from Saima Athar, MD sent at 02/11/2015  9:49 AM EDT ----- I reviewed the patient's CPAP compliance data from 01/01/2015 through 01/30/2015 which is a total of 30 days during which time he used his machine every night with percent used days greater than 4 hours at 87%, indicating very good compliance with an average usage for all days of 5 hours and 25 minutes on a pressure of 8 cm with EPR of 1. Residual AHI acceptable at 4.4 per hour but leak at times high with the 95th percentile at 41.8 L/m. Please call patient to advise him to get in touch with his DME company, [facility] to see if they can help him with his air leak from the mask. He may need new supplies or a different type of mask if he is up for a mask renewal. He has an appointment with me on 03/03/2015 at 1 PM and please remind him of his appointment as well. Everything else looks good. Saima Athar, MD, PhD [facility] ([facility]  

## 2015-03-03 ENCOUNTER — Ambulatory Visit (INDEPENDENT_AMBULATORY_CARE_PROVIDER_SITE_OTHER): Payer: 59 | Admitting: Neurology

## 2015-03-03 ENCOUNTER — Encounter: Payer: Self-pay | Admitting: Neurology

## 2015-03-03 VITALS — BP 136/88 | HR 44 | Resp 16 | Ht 72.0 in | Wt 226.0 lb

## 2015-03-03 DIAGNOSIS — G4733 Obstructive sleep apnea (adult) (pediatric): Secondary | ICD-10-CM

## 2015-03-03 DIAGNOSIS — Z9989 Dependence on other enabling machines and devices: Principal | ICD-10-CM

## 2015-03-03 NOTE — Patient Instructions (Addendum)
Please continue using your CPAP regularly. While your insurance requires that you use CPAP at least 4 hours each night on 70% of the nights, I recommend, that you not skip any nights and use it throughout the night if you can. Getting used to CPAP and staying with the treatment long term does take time and patience and discipline. Untreated obstructive sleep apnea when it is moderate to severe can have an adverse impact on cardiovascular health and raise her risk for heart disease, arrhythmias, hypertension, congestive heart failure, stroke and diabetes. Untreated obstructive sleep apnea causes sleep disruption, nonrestorative sleep, and sleep deprivation. This can have an impact on your day to day functioning and cause daytime sleepiness and impairment of cognitive function, memory loss, mood disturbance, and problems focussing. Using CPAP regularly can improve these symptoms.  We will increase your pressure to 10 cm. We will try you on a nasal mask.

## 2015-03-03 NOTE — Progress Notes (Signed)
Subjective:    Patient ID: Jake Martin is a 54 y.o. male.  HPI     Interim history:   Jake Martin is a 54 year old right-handed gentleman with an underlying medical history of morbid obesity, umbilical hernia, hypertension, carpal tunnel syndrome, hyperlipidemia and pneumonia in 2013, who presents for follow-up consultation of his obstructive sleep apnea, now on treatment with CPAP therapy. The patient is unaccompanied today. I first met him on 09/09/2014 at the request of his primary care physician, at which time the patient reported snoring, daytime somnolence, and witnessed apneas. He also endorsed recent weight gain in the context of recent increase in stress. He had nocturia 1 usually. I invited him back for sleep study. He had a split-night sleep study on 10/07/2014 and went over his test results with him in detail today. His baseline sleep efficiency was 38.1% with a latency to sleep of 27.5 minutes and wake after sleep onset of 44 minutes with severe sleep fragmentation noted. He had a markedly elevated arousal index. He had an increased percentage of light stage sleep and absence of slow-wave and REM sleep. He had mild to moderate snoring. Average oxygen saturation was 90%, nadir was 83%. He had a total AHI of 81.8 per hour. He was then titrated on CPAP. Sleep efficiency was 63.5%. His arousal index improved. He had an increased percentage of REM sleep during the treatment portion of the study. Average oxygen saturation was 93%, nadir was 82%. He was titrated from 5-11 cm with a reduction of his AHI to 1.7 per hour at the final pressure. Supine REM sleep was not achieved. The patient indicated that he did not sleep on his back. There were no significant periodic leg movements before or after his CPAP. Based on the test results are prescribed CPAP therapy for him. I prescribed a pressure of 11 cm, but he appears to have been on 8 cm all along.   I reviewed the patient's CPAP compliance  data from 01/01/2015 through 01/30/2015 which is a total of 30 days during which time he used his machine every night with percent used days greater than 4 hours at 87%, indicating very good compliance with an average usage for all days of 5 hours and 25 minutes on a pressure of 8 cm with EPR of 1. Residual AHI acceptable at 4.4 per hour but leak at times high with the 95th percentile at 41.8 L/m.   In the interim, we contacted him with his compliance data results and requested that he meet with his DME company for adjusting of his mask. I also prescribed a nose mask with chinstrap for him and we faxed the order to his DME company.  Today, 03/03/2015: I reviewed his CPAP compliance data from 01/31/2015 through 03/01/2015 which is a total of 30 days during which time he used his machine 29 days with percent used days greater than 4 hours of 73%, indicating adequate compliance with an average usage of 5 hours and 1 minute, residual AHI slightly suboptimal at 6.4 per hour and leak acceptable with the 95th percentile at 19.5 L/m on a pressure of 8 cm.  Today, 03/03/2015: He reports that he feels much better. His sleep quality is much better. He hardly has to get up to use the bathroom. He feels much better rested and has more daytime energy. He is working on weight loss. He saw his primary care physician was reminded to work on weight loss. He tries to drink more water. 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 He realizes he does have to do better with his sleep time.  Previously:  He reports snoring and witnessed apneas per wife as well as daytime somnolence. His ESS is 10-12. He endorses stress and recent weight gain. He does not nap. He denies morning headaches or RLS symptoms. Snoring is loud. He has to go to the bathroom once per night, usually when his dog wakes him up in the early morning hours. He is not known to kick in his sleep. He denies  nocturnal acid reflux symptoms. He does have nasal congestion which seems to be seasonal and occasionally has postnasal drip.  He goes to bed around midnight and typically falls asleep okay. He wakes up once per night on average and goes to the bathroom once per night on average. He estimates that he gets about 5-6 hours of sleep per night. He does wake up rested but has daytime tiredness as the day progresses. He is a Psychiatrist. Rarely will he turned on his sides. He does not sleep on his back. He drinks a lot of caffeine, approximately 10 caffeine containing beverages per day including 6 cups of coffee and unsweet tea. He does not drink very much water at all. He may have a family history of obstructive sleep apnea in his mother.   He denies cataplexy, sleep paralysis, hypnagogic or hypnopompic hallucinations, or sleep attacks. He does not report any vivid dreams, nightmares, dream enactments, or parasomnias, such as sleep talking or sleep walking. The patient has not had a sleep study or a home sleep test.   He does not smoke. He drinks alcohol approximately 3-5 glasses of wine per week, sometimes in the form of beer in the summer time.  His Past Medical History Is Significant For: Past Medical History  Diagnosis Date  . Hypertension   . Hernia   . Obesity   . Pneumonia   . Hyperlipidemia   . Umbilical hernia   . Carpal tunnel syndrome, right     His Past Surgical History Is Significant For: Past Surgical History  Procedure Laterality Date  . Hernia repair  2004  . Eye surgery  [phone]  . Appendectomy  2005    His Family History Is Significant For: Family History  Problem Relation Age of Onset  . Diabetes Other   . Thyroid disease Other   . Hypertension Sister   . COPD Father   . Hypertension Father   . Diabetes type II      GRANDFATHER  . Coronary artery disease      GRANDMOTHER  . Aneurysm Mother     BRAIN  . Diabetes Paternal Grandmother   . Diabetes Son      His Social History Is Significant For: History   Social History  . Marital Status: Married    Spouse Name: Jake Martin  . Number of Children: 2  . Years of Education: 15   Occupational History  .      [employer] adviser   Social History Main Topics  . Smoking status: Never Smoker   . Smokeless tobacco: Never Used  . Alcohol Use: 0.0 oz/week    0 Standard drinks or equivalent per week     Comment: wine 3-5 glasses weekly  . Drug Use: No  . Sexual Activity: Not on file   Other Topics Concern  . None   Social History  Narrative   Right handed and consumes 3-5 cups of caffeine daily    His Allergies Are:  No Known Allergies:   His Current Medications Are:  Outpatient Encounter Prescriptions as of 03/03/2015  Medication Sig  . albuterol (PROVENTIL HFA;VENTOLIN HFA) 108 (90 BASE) MCG/ACT inhaler Inhale 2 puffs into the lungs every 4 (four) hours as needed for wheezing or shortness of breath (2 PUFFS EVERY 4 TO 6 HOURS AS NEEDED FOR COUGH).  . Aspirin-Acetaminophen (GOODY BODY PAIN) 500-325 MG PACK Take 1 packet by mouth every 6 (six) hours as needed. For pain  . Cyanocobalamin (B-12 PO) Take 1 tablet by mouth daily.  Marland Kitchen ibuprofen (ADVIL,MOTRIN) 200 MG tablet Take 400 mg by mouth every 6 (six) hours as needed. For pain  . irbesartan-hydrochlorothiazide (AVALIDE) 150-12.5 MG per tablet Take 1 tablet by mouth daily.  . mometasone-formoterol (DULERA) 100-5 MCG/ACT AERO Inhale 2 puffs into the lungs 2 (two) times daily.  . Multiple Vitamins-Minerals (MULTIVITAMIN WITH MINERALS) tablet Take 1 tablet by mouth daily.  . pravastatin (PRAVACHOL) 40 MG tablet Take 40 mg by mouth daily.  :  Review of Systems:  Out of a complete 14 point review of systems, all are reviewed and negative with the exception of these symptoms as listed below:   Review of Systems  All other systems reviewed and are negative.   Objective:  Neurologic Exam  Physical Exam Physical Examination:   Filed  Vitals:   03/03/15 1256  BP: 136/88  Pulse: 44  Resp: 16    General Examination: The patient is a very pleasant 53 y.o. male in no acute distress. He appears well-developed and well-nourished and well groomed. He is obese.  HEENT: Normocephalic, atraumatic, pupils are equal, round and reactive to light and accommodation. Funduscopic exam is normal with sharp disc margins noted. Extraocular tracking is good without limitation to gaze excursion or nystagmus noted. Normal smooth pursuit is noted. Hearing is grossly intact. Face is symmetric with normal facial animation and normal facial sensation. Speech is clear with no dysarthria noted. There is no hypophonia. There is no lip, neck/head, jaw or voice tremor. Neck is supple with full range of passive and active motion. There are no carotid bruits on auscultation. Oropharynx exam reveals: mild mouth dryness, good dental hygiene and marked airway crowding, due to thick soft palate, redundant soft palate, larger uvula, thicker tongue, and tonsils are 2+ bilaterally. Mallampati is class III. Tongue protrudes centrally and palate elevates symmetrically. Neck size is 18.5 inches. He has a Absent overbite. Nasal inspection reveals no significant nasal mucosal bogginess or redness and no septal deviation.   Chest: Clear to auscultation without wheezing, rhonchi or crackles noted.  Heart: S1+S2+0, regular and normal without murmurs, rubs or gallops noted.   Abdomen: Soft, non-tender and non-distended with normal bowel sounds appreciated on auscultation.  Extremities: There is no pitting edema in the distal lower extremities bilaterally. Pedal pulses are intact.  Skin: Warm and dry without trophic changes noted. There are no varicose veins, but he has spider veins and chronic appearing mild discoloration in the distal lower extremities bilaterally.  Musculoskeletal: exam reveals no obvious joint deformities, tenderness or joint swelling or erythema.    Neurologically:  Mental status: The patient is awake, alert and oriented in all 4 spheres. His immediate and remote memory, attention, language skills and fund of knowledge are appropriate. There is no evidence of aphasia, agnosia, apraxia or anomia. Speech is clear with normal prosody and enunciation. Thought process is  linear. Mood is normal and affect is normal.  Cranial nerves II - XII are as described above under HEENT exam. In addition: shoulder shrug is normal with equal shoulder height noted. Motor exam: Normal bulk, strength and tone is noted. There is no drift, tremor or rebound. Romberg is negative. Reflexes are 2+ throughout. Fine motor skills and coordination: intact with normal finger taps, normal hand movements, normal rapid alternating patting, normal foot taps and normal foot agility.  Cerebellar testing: No dysmetria or intention tremor on finger to nose testing. Heel to shin is unremarkable bilaterally. There is no truncal or gait ataxia.  Sensory exam: intact to light touch.  Gait, station and balance: He stands easily. No veering to one side is noted. No leaning to one side is noted. Posture is age-appropriate and stance is narrow based. Gait shows normal stride length and normal pace. No problems turning are noted. He turns en bloc. Tandem walk is unremarkable.                Assessment and Plan:  In summary, Jake Martin is a very pleasant 54 year old male with an underlying medical history of morbid obesity, umbilical hernia, hypertension, carpal tunnel syndrome, hyperlipidemia and pneumonia in 2013, who was diagnosed with severe obstructive sleep apnea in November. He has been on CPAP therapy. I prescribed 11 cm of pressure but he has been on 8 cm all along for some reason. He would like to try nasal mask. He has been on a full face mask because he preferred it at the time but he has adopted to breathing more through his nose. He had mild residual sleep disordered  breathing when on a pressure of 8 cm with his oxygen saturation was better on 10 and 11 cm. At this juncture, I suggested that we change his pressure to 10 cm and try it out and I will monitor a 30 day CPAP download. In addition, I would like for him to try a nasal mask with or without a chin strap if needed. We talked about his sleep test results in detail and his most recent compliance data. I explained the findings to him. I answered all his questions today. His physical exam is stable. He is again encouraged strongly to try to lose weight. He has had great results with CPAP therapy and feels much improved. I congratulated him on his compliance but did advise him to try to make room for more sleep time in a better schedule around his sleep. He is encouraged to drink more water and exercise regularly.   I explained the importance of being compliant with PAP treatment, not only for insurance purposes but primarily to improve His symptoms, and for the patient's long term health benefit, including to reduce His cardiovascular risks. I would like to see him back in 6 months, sooner if the need arises. I answered all  his  questions today and the patient was in agreement. I encouraged  him  to call with any interim questions, concerns, problems or updates.  I spent 25inutes in total face-to-face time with the patient, more than 50% of which was spent in counseling and coordination of care, reviewing test results, reviewing medication and discussing or reviewing the diagnosis of OSA, its prognosis and treatment options.

## 2015-03-16 ENCOUNTER — Encounter: Payer: Self-pay | Admitting: Neurology

## 2015-06-06 ENCOUNTER — Encounter: Payer: Self-pay | Admitting: Neurology

## 2015-09-01 ENCOUNTER — Ambulatory Visit (INDEPENDENT_AMBULATORY_CARE_PROVIDER_SITE_OTHER): Payer: 59 | Admitting: Neurology

## 2015-09-01 ENCOUNTER — Encounter: Payer: Self-pay | Admitting: Neurology

## 2015-09-01 VITALS — BP 144/92 | HR 70 | Resp 18 | Ht 72.0 in | Wt 327.0 lb

## 2015-09-01 DIAGNOSIS — G4733 Obstructive sleep apnea (adult) (pediatric): Secondary | ICD-10-CM

## 2015-09-01 DIAGNOSIS — Z9989 Dependence on other enabling machines and devices: Principal | ICD-10-CM

## 2015-09-01 NOTE — Patient Instructions (Signed)
Doing good, FU in 1 year! Try to lose weight, drink more water!

## 2015-09-01 NOTE — Progress Notes (Signed)
Subjective:    Patient ID: Jake Martin is a 54 y.o. male.  HPI     Interim history:   Jake Martin is a 54 year old right-handed gentleman with an underlying medical history of morbid obesity, umbilical hernia, hypertension, carpal tunnel syndrome, hyperlipidemia and pneumonia in 2013, who presents for follow-up consultation of his obstructive sleep apnea, now on treatment with CPAP therapy. The patient is unaccompanied today. I last saw him on 03/03/2015, at which time he reported feeling much better. He felt that his sleep quality was much better and his nocturia had improved. He woke up better rested with more daytime energy. He was working on weight loss. He was drinking more water.  Today, 09/01/2015: I reviewed his CPAP compliance data from 08/01/2015 through 08/30/2015 which is a total of 30 days during which time his machine every night with percent used days greater than 4 hours at 90%, indicating excellent compliance with an average usage of 5 hours and 58 minutes, residual AHI reasonable at 3.7 per hour, leak at times high with the 95th percentile at 47.7 L/m on a pressure of 10 cm.   Today, 09/01/2015: He reports doing well. He has no problems with the CPAP other than needing a new head gear. This may explain his air leaking. He has received new pillows. He is compliant with treatment. He does not always get 6 hours of sleep but he is always been somebody who sleeps less. In the past used to sleep 4-5 hours only. He is still benefiting from CPAP and has recently seen his PCP and had blood work. This per his verbal report was fine. He was again advised to lose weight.  Previously:  I first met him on 09/09/2014 at the request of his primary care physician, at which time the patient reported snoring, daytime somnolence, and witnessed apneas. He also endorsed recent weight gain in the context of recent increase in stress. He had nocturia 1 usually. I invited him back for sleep  study. He had a split-night sleep study on 10/07/2014 and went over his test results with him in detail today. His baseline sleep efficiency was 38.1% with a latency to sleep of 27.5 minutes and wake after sleep onset of 44 minutes with severe sleep fragmentation noted. He had a markedly elevated arousal index. He had an increased percentage of light stage sleep and absence of slow-wave and REM sleep. He had mild to moderate snoring. Average oxygen saturation was 90%, nadir was 83%. He had a total AHI of 81.8 per hour. He was then titrated on CPAP. Sleep efficiency was 63.5%. His arousal index improved. He had an increased percentage of REM sleep during the treatment portion of the study. Average oxygen saturation was 93%, nadir was 82%. He was titrated from 5-11 cm with a reduction of his AHI to 1.7 per hour at the final pressure. Supine REM sleep was not achieved. The patient indicated that he did not sleep on his back. There were no significant periodic leg movements before or after his CPAP. Based on the test results are prescribed CPAP therapy for him. I prescribed a pressure of 11 cm, but he appears to have been on 8 cm all along.    I reviewed the patient's CPAP compliance data from 01/01/2015 through 01/30/2015 which is a total of 30 days during which time he used his machine every night with percent used days greater than 4 hours at 87%, indicating very good compliance with an average usage for  all days of 5 hours and 25 minutes on a pressure of 8 cm with EPR of 1. Residual AHI acceptable at 4.4 per hour but leak at times high with the 95th percentile at 41.8 L/m.   In the interim, we contacted him with his compliance data results and requested that he meet with his DME company for adjusting of his mask. I also prescribed a nose mask with chinstrap for him and we faxed the order to his DME company.  I reviewed his CPAP compliance data from 01/31/2015 through 03/01/2015 which is a total of 30 days  during which time he used his machine 29 days with percent used days greater than 4 hours of 73%, indicating adequate compliance with an average usage of 5 hours and 1 minute, residual AHI slightly suboptimal at 6.4 per hour and leak acceptable with the 95th percentile at 19.5 L/m on a pressure of 8 cm.  He reports snoring and witnessed apneas per wife as well as daytime somnolence. His ESS is 10-12. He endorses stress and recent weight gain. He does not nap. He denies morning headaches or RLS symptoms. Snoring is loud. He has to go to the bathroom once per night, usually when his dog wakes him up in the early morning hours. He is not known to kick in his sleep. He denies nocturnal acid reflux symptoms. He does have nasal congestion which seems to be seasonal and occasionally has postnasal drip.  He goes to bed around midnight and typically falls asleep okay. He wakes up once per night on average and goes to the bathroom once per night on average. He estimates that he gets about 5-6 hours of sleep per night. He does wake up rested but has daytime tiredness as the day progresses. He is a Psychiatrist. Rarely will he turned on his sides. He does not sleep on his back. He drinks a lot of caffeine, approximately 10 caffeine containing beverages per day including 6 cups of coffee and unsweet tea. He does not drink very much water at all. He may have a family history of obstructive sleep apnea in his mother.   He denies cataplexy, sleep paralysis, hypnagogic or hypnopompic hallucinations, or sleep attacks. He does not report any vivid dreams, nightmares, dream enactments, or parasomnias, such as sleep talking or sleep walking. The patient has not had a sleep study or a home sleep test.   He does not smoke. He drinks alcohol approximately 3-5 glasses of wine per week, sometimes in the form of beer in the summer time.    His Past Medical History Is Significant For: Past Medical History  Diagnosis Date  .  Hypertension   . Hernia   . Obesity   . Pneumonia   . Hyperlipidemia   . Umbilical hernia   . Carpal tunnel syndrome, right     His Past Surgical History Is Significant For: Past Surgical History  Procedure Laterality Date  . Hernia repair  2004  . Eye surgery  [phone]  . Appendectomy  2005    His Family History Is Significant For: Family History  Problem Relation Age of Onset  . Diabetes Other   . Thyroid disease Other   . Hypertension Sister   . COPD Father   . Hypertension Father   . Diabetes type II      GRANDFATHER  . Coronary artery disease      GRANDMOTHER  . Aneurysm Mother     BRAIN  . Diabetes Paternal Grandmother   .  Diabetes Son     His Social History Is Significant For: Social History   Social History  . Marital Status: Married    Spouse Name: Otila Kluver  . Number of Children: 2  . Years of Education: 15   Occupational History  .      [employer] adviser   Social History Main Topics  . Smoking status: Never Smoker   . Smokeless tobacco: Never Used  . Alcohol Use: 0.0 oz/week    0 Standard drinks or equivalent per week     Comment: wine 3-5 glasses weekly  . Drug Use: No  . Sexual Activity: Not Asked   Other Topics Concern  . None   Social History Narrative   Right handed and consumes 3-5 cups of caffeine daily    His Allergies Are:  No Known Allergies:   His Current Medications Are:  Outpatient Encounter Prescriptions as of 09/01/2015  Medication Sig  . Aspirin-Acetaminophen (GOODY BODY PAIN) 500-325 MG PACK Take 1 packet by mouth every 6 (six) hours as needed. For pain  . atenolol (TENORMIN) 100 MG tablet   . Cyanocobalamin (B-12 PO) Take 1 tablet by mouth daily.  Marland Kitchen ibuprofen (ADVIL,MOTRIN) 200 MG tablet Take 400 mg by mouth every 6 (six) hours as needed. For pain  . Multiple Vitamins-Minerals (MULTIVITAMIN WITH MINERALS) tablet Take 1 tablet by mouth daily.  . pravastatin (PRAVACHOL) 40 MG tablet Take 40 mg by mouth daily.  .  [DISCONTINUED] albuterol (PROVENTIL HFA;VENTOLIN HFA) 108 (90 BASE) MCG/ACT inhaler Inhale 2 puffs into the lungs every 4 (four) hours as needed for wheezing or shortness of breath (2 PUFFS EVERY 4 TO 6 HOURS AS NEEDED FOR COUGH).  . [DISCONTINUED] irbesartan-hydrochlorothiazide (AVALIDE) 150-12.5 MG per tablet Take 1 tablet by mouth daily.  . [DISCONTINUED] mometasone-formoterol (DULERA) 100-5 MCG/ACT AERO Inhale 2 puffs into the lungs 2 (two) times daily.   No facility-administered encounter medications on file as of 09/01/2015.  :  Review of Systems:  Out of a complete 14 point review of systems, all are reviewed and negative with the exception of these symptoms as listed below:   Review of Systems  Neurological:       No new concerns. States that the increase in CPAP pressure has helped.     Objective:  Neurologic Exam  Physical Exam Physical Examination:   Filed Vitals:   09/01/15 0833  BP: 144/92  Pulse: 70  Resp: 18    General Examination: The patient is a very pleasant 54 y.o. male in no acute distress. He appears well-developed and well-nourished and well groomed. He is obese.  HEENT: Normocephalic, atraumatic, pupils are equal, round and reactive to light and accommodation. Extraocular tracking is good without limitation to gaze excursion or nystagmus noted. Normal smooth pursuit is noted. Hearing is grossly intact. Face is symmetric with normal facial animation and normal facial sensation. Speech is clear with no dysarthria noted. There is no hypophonia. There is no lip, neck/head, jaw or voice tremor. Neck is supple with full range of passive and active motion. There are no carotid bruits on auscultation. Oropharynx exam reveals: mild mouth dryness, good dental hygiene and marked airway crowding, due to thick soft palate, redundant soft palate, larger uvula, thicker tongue, and tonsils are 2+ bilaterally. Mallampati is class III. Tongue protrudes centrally and palate  elevates symmetrically.    Chest: Clear to auscultation without wheezing, rhonchi or crackles noted.  Heart: S1+S2+0, regular and normal without murmurs, rubs or gallops noted.  Abdomen: Soft, non-tender and non-distended with normal bowel sounds appreciated on auscultation.  Extremities: There is trace pitting edema in the distal lower extremities bilaterally. Pedal pulses are intact.  Skin: Warm and dry without trophic changes noted. There are no varicose veins, but he has spider veins and chronic appearing mild discoloration in the distal lower extremities bilaterally.  Musculoskeletal: exam reveals no obvious joint deformities, tenderness or joint swelling or erythema.   Neurologically:  Mental status: The patient is awake, alert and oriented in all 4 spheres. His immediate and remote memory, attention, language skills and fund of knowledge are appropriate. There is no evidence of aphasia, agnosia, apraxia or anomia. Speech is clear with normal prosody and enunciation. Thought process is linear. Mood is normal and affect is normal.  Cranial nerves II - XII are as described above under HEENT exam. In addition: shoulder shrug is normal with equal shoulder height noted. Motor exam: Normal bulk, strength and tone is noted. There is no drift, tremor or rebound. Romberg is negative. Reflexes are 2+ in the UEs and 1+ in the LEs. Fine motor skills and coordination: intact. There is no truncal or gait ataxia.  Sensory exam: intact to light touch.  Gait, station and balance: He stands easily. No veering to one side is noted. No leaning to one side is noted. Posture is age-appropriate and stance is narrow based. Gait shows normal stride length and normal pace. No problems turning are noted. He turns en bloc. Tandem walk is unremarkable.                Assessment and Plan:  In summary, Jake Martin is a very pleasant 54 year old male with an underlying medical history of morbid obesity,  umbilical hernia, hypertension, carpal tunnel syndrome, hyperlipidemia and pneumonia in 2013, who presents for follow-up consultation of his severe obstructive sleep apnea diagnosed via split-night sleep study in November 2015, now well established on CPAP therapy at a pressure of 10 cm with good results and good compliance. He is again encouraged to use CPAP regularly and not to skip any nights. He is again advised to drink more water and try to lose weight. He is reminded about his previous sleep test results and we've reviewed his most recent compliance data together as well. His exam is stable. He is doing well from my end of things. I suggested a one-year checkup, sooner if needed. I placed an order for CPAP supplies so he can get also a new head strap.  He has had great results with CPAP therapy and feels much improved. I congratulated him on his compliance but did advise him to try to make room for more sleep time in a better schedule around his sleep. He is encouraged to drink more water and exercise regularly.   I explained the importance of being compliant with PAP treatment, not only for insurance purposes but primarily to improve His symptoms, and for the patient's long term health benefit, including to reduce His cardiovascular risks. I would like to see him back in 12 months.  I spent 20 minutes in total face-to-face time with the patient, more than 50% of which was spent in counseling and coordination of care, reviewing test results, reviewing medication and discussing or reviewing the diagnosis of OSA, its prognosis and treatment options.

## 2015-11-23 MED FILL — PRAVASTATIN NA 40 MG TAB: 40 | 30 days supply | Qty: 30 | Fill #1

## 2015-11-25 DIAGNOSIS — G4733 Obstructive sleep apnea (adult) (pediatric): Secondary | ICD-10-CM | POA: Diagnosis not present

## 2015-11-29 DIAGNOSIS — Z6841 Body Mass Index (BMI) 40.0 and over, adult: Secondary | ICD-10-CM | POA: Diagnosis not present

## 2015-11-29 DIAGNOSIS — E669 Obesity, unspecified: Secondary | ICD-10-CM | POA: Diagnosis not present

## 2015-11-29 DIAGNOSIS — I10 Essential (primary) hypertension: Secondary | ICD-10-CM | POA: Diagnosis not present

## 2015-11-29 MED FILL — HYDROCHLOROTHIAZIDE 25 MG T: 25 | 30 days supply | Qty: 30 | Fill #0

## 2015-11-29 MED FILL — AMLODIPINE BESYLATE 2.5 MG: 2.5 | 30 days supply | Qty: 30 | Fill #0

## 2015-12-26 MED FILL — HYDROCHLOROTHIAZIDE 25 MG T: 25 | 30 days supply | Qty: 30 | Fill #1

## 2015-12-26 MED FILL — AMLODIPINE BESYLATE 2.5 MG: 2.5 | 30 days supply | Qty: 30 | Fill #1

## 2016-01-30 MED FILL — AMLODIPINE BESYLATE 2.5 MG: 2.5 | 30 days supply | Qty: 30 | Fill #0

## 2016-01-30 MED FILL — HYDROCHLOROTHIAZIDE 25 MG T: 25 | 30 days supply | Qty: 30 | Fill #0

## 2016-02-27 DIAGNOSIS — Z125 Encounter for screening for malignant neoplasm of prostate: Secondary | ICD-10-CM | POA: Diagnosis not present

## 2016-02-27 DIAGNOSIS — E784 Other hyperlipidemia: Secondary | ICD-10-CM | POA: Diagnosis not present

## 2016-02-27 DIAGNOSIS — R7301 Impaired fasting glucose: Secondary | ICD-10-CM | POA: Diagnosis not present

## 2016-02-27 DIAGNOSIS — I10 Essential (primary) hypertension: Secondary | ICD-10-CM | POA: Diagnosis not present

## 2016-02-28 DIAGNOSIS — G4733 Obstructive sleep apnea (adult) (pediatric): Secondary | ICD-10-CM | POA: Diagnosis not present

## 2016-02-28 MED FILL — AMLODIPINE BESYLATE 2.5 MG: 2.5 | 30 days supply | Qty: 30 | Fill #1

## 2016-02-28 MED FILL — HYDROCHLOROTHIAZIDE 25 MG T: 25 | 30 days supply | Qty: 30 | Fill #1

## 2016-03-05 ENCOUNTER — Encounter: Payer: Self-pay | Admitting: Internal Medicine

## 2016-03-05 DIAGNOSIS — J309 Allergic rhinitis, unspecified: Secondary | ICD-10-CM | POA: Diagnosis not present

## 2016-03-05 DIAGNOSIS — K429 Umbilical hernia without obstruction or gangrene: Secondary | ICD-10-CM | POA: Diagnosis not present

## 2016-03-05 DIAGNOSIS — I10 Essential (primary) hypertension: Secondary | ICD-10-CM | POA: Diagnosis not present

## 2016-03-05 DIAGNOSIS — R7301 Impaired fasting glucose: Secondary | ICD-10-CM | POA: Diagnosis not present

## 2016-03-05 DIAGNOSIS — F5221 Male erectile disorder: Secondary | ICD-10-CM | POA: Diagnosis not present

## 2016-03-05 DIAGNOSIS — Z0000 Encounter for general adult medical examination without abnormal findings: Secondary | ICD-10-CM | POA: Diagnosis not present

## 2016-03-05 DIAGNOSIS — E784 Other hyperlipidemia: Secondary | ICD-10-CM | POA: Diagnosis not present

## 2016-03-05 DIAGNOSIS — T783XXA Angioneurotic edema, initial encounter: Secondary | ICD-10-CM | POA: Diagnosis not present

## 2016-03-05 DIAGNOSIS — M545 Low back pain: Secondary | ICD-10-CM | POA: Diagnosis not present

## 2016-03-05 MED FILL — CYCLOBENZAPRINE 10 MG TAB: 10 | 10 days supply | Qty: 30 | Fill #0

## 2016-04-02 MED FILL — AMLODIPINE BESYLATE 2.5 MG: 2.5 | 90 days supply | Qty: 90 | Fill #0

## 2016-04-02 MED FILL — HYDROCHLOROTHIAZIDE 25 MG T: 25 | 90 days supply | Qty: 90 | Fill #0

## 2016-04-03 ENCOUNTER — Ambulatory Visit (AMBULATORY_SURGERY_CENTER): Payer: Self-pay

## 2016-04-03 VITALS — Ht 71.0 in | Wt 296.4 lb

## 2016-04-03 DIAGNOSIS — Z1211 Encounter for screening for malignant neoplasm of colon: Secondary | ICD-10-CM

## 2016-04-03 NOTE — Progress Notes (Signed)
Per pt, no allergies to soy or egg products.Pt not taking any weight loss meds or using  O2 at home. 

## 2016-04-17 ENCOUNTER — Encounter: Payer: Self-pay | Admitting: Internal Medicine

## 2016-04-17 ENCOUNTER — Ambulatory Visit (AMBULATORY_SURGERY_CENTER): Payer: 59 | Admitting: Internal Medicine

## 2016-04-17 VITALS — BP 136/86 | HR 60 | Temp 97.8°F | Resp 15 | Ht 71.0 in | Wt 296.0 lb

## 2016-04-17 DIAGNOSIS — Z1211 Encounter for screening for malignant neoplasm of colon: Secondary | ICD-10-CM

## 2016-04-17 DIAGNOSIS — G4733 Obstructive sleep apnea (adult) (pediatric): Secondary | ICD-10-CM | POA: Diagnosis not present

## 2016-04-17 DIAGNOSIS — I10 Essential (primary) hypertension: Secondary | ICD-10-CM | POA: Diagnosis not present

## 2016-04-17 MED ORDER — SODIUM CHLORIDE 0.9 % IV SOLN: 500.0000 mL | INTRAVENOUS | Status: DC

## 2016-04-17 NOTE — Progress Notes (Signed)
No problems noted in the recovery room. maw 

## 2016-04-17 NOTE — Progress Notes (Signed)
Patient awakening,vss,report to rn 

## 2016-04-17 NOTE — Patient Instructions (Addendum)
No polyps or cancer seen. You do have staining of the colon lining called melanosis - usually from Senna - in laxatives and some herbal supplements. Not a problem.  Next routine colonoscopy in 10 years - 2027  I appreciate the opportunity to care for you. Iva Booparl E. Gessner, MD, FACG    YOU HAD AN ENDOSCOPIC PROCEDURE TODAY AT THE [facility]:   Refer to the procedure report that was given to you for any specific questions about what was found during the examination.  If the procedure report does not answer your questions, please call your gastroenterologist to clarify.  If you requested that your care partner not be given the details of your procedure findings, then the procedure report has been included in a sealed envelope for you to review at your convenience later.  YOU SHOULD EXPECT: Some feelings of bloating in the abdomen. Passage of more gas than usual.  Walking can help get rid of the air that was put into your GI tract during the procedure and reduce the bloating. If you had a lower endoscopy (such as a colonoscopy or flexible sigmoidoscopy) you may notice spotting of blood in your stool or on the toilet paper. If you underwent a bowel prep for your procedure, you may not have a normal bowel movement for a few days.  Please Note:  You might notice some irritation and congestion in your nose or some drainage.  This is from the oxygen used during your procedure.  There is no need for concern and it should clear up in a day or so.  SYMPTOMS TO REPORT IMMEDIATELY:   Following lower endoscopy (colonoscopy or flexible sigmoidoscopy):  Excessive amounts of blood in the stool  Significant tenderness or worsening of abdominal pains  Swelling of the abdomen that is new, acute  Fever of 100F or higher   For urgent or emergent issues, a gastroenterologist can be reached at any hour by calling [phone].   DIET: Your first meal following the procedure should be a  small meal and then it is ok to progress to your normal diet. Heavy or fried foods are harder to digest and may make you feel nauseous or bloated.  Likewise, meals heavy in dairy and vegetables can increase bloating.  Drink plenty of fluids but you should avoid alcoholic beverages for 24 hours.  ACTIVITY:  You should plan to take it easy for the rest of today and you should NOT DRIVE or use heavy machinery until tomorrow (because of the sedation medicines used during the test).    FOLLOW UP: Our staff will call the number listed on your records the next business day following your procedure to check on you and address any questions or concerns that you may have regarding the information given to you following your procedure. If we do not reach you, we will leave a message.  However, if you are feeling well and you are not experiencing any problems, there is no need to return our call.  We will assume that you have returned to your regular daily activities without incident.  If any biopsies were taken you will be contacted by phone or by letter within the next 1-3 weeks.  Please call us at [phone] if you have not heard about the biopsies in 3 weeks.    SIGNATURES/CONFIDENTIALITY: You and/or your care partner have signed paperwork which will be entered into your electronic medical record.  These signatures attest to the fact that  that the information above on your After Visit Summary has been reviewed and is understood.  Full responsibility of the confidentiality of this discharge information lies with you and/or your care-partner.     You may resume your current medications today. Repeat routine screening colonoscopy in 10 years. Please call if any questions or concerns.

## 2016-04-17 NOTE — Op Note (Signed)
[facility] Patient Name: Jake ChampionLeonard Riedinger Procedure Date: 04/17/2016 10:45 AM MRN: [id number] Endoscopist: Iva Booparl E Rowland Ericsson , MD Age: 55 Referring MD:  Date of Birth: 1961-02-10 Gender: Male Procedure:                Colonoscopy Indications:              Screening for colorectal malignant neoplasm Medicines:                Propofol per Anesthesia, Monitored Anesthesia Care Procedure:                Pre-Anesthesia Assessment:                           - Prior to the procedure, a History and Physical                            was performed, and patient medications and                            allergies were reviewed. The patient's tolerance of                            previous anesthesia was also reviewed. The risks                            and benefits of the procedure and the sedation                            options and risks were discussed with the patient.                            All questions were answered, and informed consent                            was obtained. Prior Anticoagulants: The patient has                            taken no previous anticoagulant or antiplatelet                            agents. ASA Grade Assessment: II - A patient with                            mild systemic disease. After reviewing the risks                            and benefits, the patient was deemed in                            satisfactory condition to undergo the procedure.                           After obtaining informed consent, the colonoscope  was passed under direct vision. Throughout the                            procedure, the patient's blood pressure, pulse, and                            oxygen saturations were monitored continuously. The                            Model CF-HQ190L [id number]) scope was introduced                            through the anus and advanced to the the cecum,                            identified  by appendiceal orifice and ileocecal                            valve. The quality of the bowel preparation was                            excellent. The colonoscopy was performed without                            difficulty. The patient tolerated the procedure                            well. The bowel preparation used was Miralax. The                            ileocecal valve, appendiceal orifice, and rectum                            were photographed. Scope In: 10:58:09 AM Scope Out: 11:10:05 AM Scope Withdrawal Time: 0 hours 9 minutes 18 seconds  Total Procedure Duration: 0 hours 11 minutes 56 seconds  Findings:                 The perianal and digital rectal examinations were                            normal. Pertinent negatives include normal prostate                            (size, shape, and consistency).                           The colon (entire examined portion) appeared normal                            exept melanosis.                           No additional abnormalities were found on  retroflexion.                           A diffuse area of moderate melanosis was found in                            the entire colon. Complications:            No immediate complications. Estimated blood loss:                            None. Estimated Blood Loss:     Estimated blood loss: none. Impression:               - The entire examined colon is normal exept for                           - Melanosis in the colon.                           - No specimens collected. Recommendation:           - Repeat colonoscopy in 10 years for screening                            purposes.                           - Patient has a contact number available for                            emergencies. The signs and symptoms of potential                            delayed complications were discussed with the                            patient. Return to normal activities  tomorrow.                            Written discharge instructions were provided to the                            patient.                           - Resume previous diet.                           - Continue present medications. Iva Boop, MD 04/17/2016 11:21:27 AM This report has been signed electronically.

## 2016-04-18 ENCOUNTER — Telehealth: Payer: Self-pay | Admitting: *Deleted

## 2016-04-18 NOTE — Telephone Encounter (Signed)
  Follow up Call-  Call back number 04/17/2016  Post procedure Call Back phone  # [phone]  Permission to leave phone message Yes     Patient questions:  Do you have a fever, pain , or abdominal swelling? No. Pain Score  0 *  Have you tolerated food without any problems? Yes.    Have you been able to return to your normal activities? Yes.    Do you have any questions about your discharge instructions: Diet   No. Medications  No. Follow up visit  No.  Do you have questions or concerns about your Care? No.  Actions: * If pain score is 4 or above: No action needed, pain <4.

## 2016-07-02 MED FILL — HYDROCHLOROTHIAZIDE 25 MG T: 25 | 90 days supply | Qty: 90 | Fill #1

## 2016-07-02 MED FILL — AMLODIPINE BESYLATE 2.5 MG: 2.5 | 90 days supply | Qty: 90 | Fill #1

## 2016-08-02 MED FILL — PROAIR RESPICLICK INHAL PWD: 108 (90 BAS | 30 days supply | Qty: 1 | Fill #0

## 2016-09-03 ENCOUNTER — Ambulatory Visit: Payer: 59 | Admitting: Neurology

## 2016-09-04 DIAGNOSIS — G4733 Obstructive sleep apnea (adult) (pediatric): Secondary | ICD-10-CM | POA: Diagnosis not present

## 2016-09-05 DIAGNOSIS — R7301 Impaired fasting glucose: Secondary | ICD-10-CM | POA: Diagnosis not present

## 2016-09-05 DIAGNOSIS — J309 Allergic rhinitis, unspecified: Secondary | ICD-10-CM | POA: Diagnosis not present

## 2016-09-05 DIAGNOSIS — G4733 Obstructive sleep apnea (adult) (pediatric): Secondary | ICD-10-CM | POA: Diagnosis not present

## 2016-09-05 DIAGNOSIS — I10 Essential (primary) hypertension: Secondary | ICD-10-CM | POA: Diagnosis not present

## 2016-09-05 DIAGNOSIS — Z23 Encounter for immunization: Secondary | ICD-10-CM | POA: Diagnosis not present

## 2016-09-05 DIAGNOSIS — R0602 Shortness of breath: Secondary | ICD-10-CM | POA: Diagnosis not present

## 2016-09-05 DIAGNOSIS — E784 Other hyperlipidemia: Secondary | ICD-10-CM | POA: Diagnosis not present

## 2016-09-05 MED FILL — BREO ELLIPTA 100-25 MCG INH: 100-25 | 30 days supply | Qty: 60 | Fill #0

## 2016-10-04 MED FILL — HYDROCHLOROTHIAZIDE 25 MG T: 25 | 30 days supply | Qty: 30 | Fill #2

## 2016-10-04 MED FILL — AMLODIPINE BESYLATE 2.5 MG: 2.5 | 30 days supply | Qty: 30 | Fill #2

## 2016-10-22 MED FILL — CEFDINIR 300 MG CAPSULE: 300 | 10 days supply | Qty: 20 | Fill #0

## 2016-10-29 MED FILL — AZITHROMYCIN 250 MG TABLET: 250 | 5 days supply | Qty: 6 | Fill #0

## 2016-10-30 MED FILL — HYDROCHLOROTHIAZIDE 25 MG T: 25 | 30 days supply | Qty: 30 | Fill #0

## 2016-10-30 MED FILL — AMLODIPINE BESYLATE 2.5 MG: 2.5 | 90 days supply | Qty: 90 | Fill #0

## 2016-11-16 MED FILL — BREO ELLIPTA 100-25 MCG INH: 100-25 | 30 days supply | Qty: 60 | Fill #1

## 2016-12-03 MED FILL — HYDROCHLOROTHIAZIDE 25 MG T: 25 | 30 days supply | Qty: 30 | Fill #1

## 2016-12-25 MED FILL — BREO ELLIPTA 100-25 MCG INH: 100-25 | 30 days supply | Qty: 60 | Fill #2

## 2017-01-04 MED FILL — HYDROCHLOROTHIAZIDE 25 MG T: 25 | 30 days supply | Qty: 30 | Fill #2

## 2017-02-05 MED FILL — BREO ELLIPTA 100-25 MCG INH: 100-25 | 30 days supply | Qty: 60 | Fill #3

## 2017-02-05 MED FILL — HYDROCHLOROTHIAZIDE 25 MG T: 25 | 30 days supply | Qty: 30 | Fill #3

## 2017-02-05 MED FILL — AMLODIPINE BESYLATE 2.5 MG: 2.5 | 90 days supply | Qty: 90 | Fill #1

## 2017-03-06 MED FILL — HYDROCHLOROTHIAZIDE 25 MG T: 25 | 30 days supply | Qty: 30 | Fill #4

## 2017-03-08 DIAGNOSIS — R7301 Impaired fasting glucose: Secondary | ICD-10-CM | POA: Diagnosis not present

## 2017-03-08 DIAGNOSIS — I10 Essential (primary) hypertension: Secondary | ICD-10-CM | POA: Diagnosis not present

## 2017-03-08 DIAGNOSIS — Z125 Encounter for screening for malignant neoplasm of prostate: Secondary | ICD-10-CM | POA: Diagnosis not present

## 2017-03-08 DIAGNOSIS — E784 Other hyperlipidemia: Secondary | ICD-10-CM | POA: Diagnosis not present

## 2017-03-15 DIAGNOSIS — Z1389 Encounter for screening for other disorder: Secondary | ICD-10-CM | POA: Diagnosis not present

## 2017-03-15 DIAGNOSIS — G4733 Obstructive sleep apnea (adult) (pediatric): Secondary | ICD-10-CM | POA: Diagnosis not present

## 2017-03-15 DIAGNOSIS — Z0000 Encounter for general adult medical examination without abnormal findings: Secondary | ICD-10-CM | POA: Diagnosis not present

## 2017-03-15 DIAGNOSIS — E784 Other hyperlipidemia: Secondary | ICD-10-CM | POA: Diagnosis not present

## 2017-03-15 DIAGNOSIS — Z6841 Body Mass Index (BMI) 40.0 and over, adult: Secondary | ICD-10-CM | POA: Diagnosis not present

## 2017-03-15 DIAGNOSIS — R7301 Impaired fasting glucose: Secondary | ICD-10-CM | POA: Diagnosis not present

## 2017-03-15 DIAGNOSIS — K429 Umbilical hernia without obstruction or gangrene: Secondary | ICD-10-CM | POA: Diagnosis not present

## 2017-03-15 DIAGNOSIS — J209 Acute bronchitis, unspecified: Secondary | ICD-10-CM | POA: Diagnosis not present

## 2017-03-15 DIAGNOSIS — I10 Essential (primary) hypertension: Secondary | ICD-10-CM | POA: Diagnosis not present

## 2017-03-15 MED FILL — CEFDINIR 300 MG CAPSULE: 300 | 7 days supply | Qty: 14 | Fill #0

## 2017-03-15 MED FILL — PRAVASTATIN NA 40 MG TAB: 40 | 90 days supply | Qty: 90 | Fill #0

## 2017-04-05 MED FILL — HYDROCHLOROTHIAZIDE 25 MG T: 25 | 30 days supply | Qty: 30 | Fill #5

## 2017-04-30 MED FILL — AMLODIPINE BESYLATE 2.5 MG: 2.5 | 30 days supply | Qty: 30 | Fill #2

## 2017-04-30 MED FILL — HYDROCHLOROTHIAZIDE 25 MG T: 25 | 30 days supply | Qty: 30 | Fill #6

## 2017-05-23 MED FILL — BREO ELLIPTA 100-25 MCG INH: 100-25 | 30 days supply | Qty: 60 | Fill #4

## 2017-06-05 MED FILL — AMLODIPINE BESYLATE 2.5 MG: 2.5 | 90 days supply | Qty: 90 | Fill #0

## 2017-06-05 MED FILL — PRAVASTATIN NA 40 MG TAB: 40 | 90 days supply | Qty: 90 | Fill #1

## 2017-06-05 MED FILL — HYDROCHLOROTHIAZIDE 25 MG T: 25 | 90 days supply | Qty: 90 | Fill #0

## 2017-07-05 MED FILL — CYCLOBENZAPRINE 10 MG TABLE: 10 | 10 days supply | Qty: 30 | Fill #0

## 2017-07-13 DIAGNOSIS — R2689 Other abnormalities of gait and mobility: Secondary | ICD-10-CM | POA: Diagnosis not present

## 2017-07-13 DIAGNOSIS — Y999 Unspecified external cause status: Secondary | ICD-10-CM | POA: Diagnosis not present

## 2017-07-13 DIAGNOSIS — S91331A Puncture wound without foreign body, right foot, initial encounter: Secondary | ICD-10-CM | POA: Diagnosis not present

## 2017-07-13 DIAGNOSIS — W458XXA Other foreign body or object entering through skin, initial encounter: Secondary | ICD-10-CM | POA: Diagnosis not present

## 2017-07-13 DIAGNOSIS — M791 Myalgia: Secondary | ICD-10-CM | POA: Diagnosis not present

## 2017-08-13 DIAGNOSIS — G4733 Obstructive sleep apnea (adult) (pediatric): Secondary | ICD-10-CM | POA: Diagnosis not present

## 2017-09-02 MED FILL — PRAVASTATIN NA 40 MG TAB: 40 | 90 days supply | Qty: 90 | Fill #2

## 2017-09-02 MED FILL — HYDROCHLOROTHIAZIDE 25 MG T: 25 | 60 days supply | Qty: 60 | Fill #1

## 2017-09-02 MED FILL — AMLODIPINE BESYLATE 2.5 MG: 2.5 | 60 days supply | Qty: 60 | Fill #1

## 2017-09-16 MED FILL — BREO ELLIPTA 100-25 MCG INH: 100-25 | 30 days supply | Qty: 60 | Fill #0

## 2017-10-25 MED FILL — BREO ELLIPTA 100-25 MCG INH: 100-25 | 30 days supply | Qty: 60 | Fill #1

## 2017-10-29 MED FILL — HYDROCHLOROTHIAZIDE 25 MG T: 25 | 90 days supply | Qty: 90 | Fill #0

## 2017-10-29 MED FILL — AMLODIPINE 2.5 MG TABLET: 2.5 | 90 days supply | Qty: 90 | Fill #0

## 2017-12-11 MED FILL — PRAVASTATIN NA 40 MG TAB: 40 | 90 days supply | Qty: 90 | Fill #3

## 2017-12-11 MED FILL — BREO ELLIPTA 100-25 MCG INH: 100-25 | 30 days supply | Qty: 60 | Fill #2

## 2018-02-04 MED FILL — HYDROCHLOROTHIAZIDE 25 MG T: 25 | 90 days supply | Qty: 90 | Fill #1

## 2018-02-04 MED FILL — BREO ELLIPTA 100-25 MCG INH: 100-25 | 30 days supply | Qty: 60 | Fill #3

## 2018-02-04 MED FILL — AMLODIPINE 2.5 MG TABLET: 2.5 | 90 days supply | Qty: 90 | Fill #1

## 2018-03-04 ENCOUNTER — Ambulatory Visit: Payer: 59 | Admitting: Neurology

## 2018-03-11 DIAGNOSIS — H524 Presbyopia: Secondary | ICD-10-CM | POA: Diagnosis not present

## 2018-03-11 DIAGNOSIS — H5213 Myopia, bilateral: Secondary | ICD-10-CM | POA: Diagnosis not present

## 2018-03-20 MED FILL — PRAVASTATIN NA 40 MG TAB: 40 | 90 days supply | Qty: 90 | Fill #0

## 2018-03-24 ENCOUNTER — Encounter: Payer: Self-pay | Admitting: Neurology

## 2018-03-26 ENCOUNTER — Encounter: Payer: Self-pay | Admitting: Neurology

## 2018-03-26 ENCOUNTER — Ambulatory Visit: Payer: 59 | Admitting: Neurology

## 2018-03-26 VITALS — BP 162/105 | HR 71 | Ht 71.0 in | Wt 311.0 lb

## 2018-03-26 DIAGNOSIS — Z0000 Encounter for general adult medical examination without abnormal findings: Secondary | ICD-10-CM | POA: Diagnosis not present

## 2018-03-26 DIAGNOSIS — R82998 Other abnormal findings in urine: Secondary | ICD-10-CM | POA: Diagnosis not present

## 2018-03-26 DIAGNOSIS — I10 Essential (primary) hypertension: Secondary | ICD-10-CM | POA: Diagnosis not present

## 2018-03-26 DIAGNOSIS — Z9989 Dependence on other enabling machines and devices: Secondary | ICD-10-CM | POA: Diagnosis not present

## 2018-03-26 DIAGNOSIS — G4733 Obstructive sleep apnea (adult) (pediatric): Secondary | ICD-10-CM | POA: Diagnosis not present

## 2018-03-26 DIAGNOSIS — R7301 Impaired fasting glucose: Secondary | ICD-10-CM | POA: Diagnosis not present

## 2018-03-26 DIAGNOSIS — Z125 Encounter for screening for malignant neoplasm of prostate: Secondary | ICD-10-CM | POA: Diagnosis not present

## 2018-03-26 NOTE — Patient Instructions (Signed)
Keep up the good work with your CPAP! We will get you switched over to [facility].

## 2018-03-26 NOTE — Progress Notes (Signed)
Subjective:    Patient ID: Jake Martin is a 57 y.o. male.  HPI     Interim history:   Mr. Banik is a 58 year old right-handed gentleman with an underlying medical history of morbid obesity, umbilical hernia, hypertension, carpal tunnel syndrome, hyperlipidemia and pneumonia in 2013, who presents for follow-up consultation of his obstructive sleep apnea, on treatment with CPAP therapy. The patient is unaccompanied today and presents after over 2 and half years. I last saw him on 09/01/2015, at which time he was compliant with CPAP therapy and reported doing well. He was advised to follow-up routinely in one year.  Today, 03/26/2018: I reviewed his CPAP compliance data from 02/23/2018 through 03/24/2018, which is a total of 30 days, during which time he used his CPAP every night with percent used days greater than 4 hours at 90%, indicating excellent compliance with an average usage of 5 hours and 38 minutes, residual AHI at goal at 2.4 per hour, leak acceptable with the 95th percentile at 15.6 L/m on a pressure of 10 cm. He reports doing well, is compliant with his CPAP machine, has started using a different full face mask after his son about the same kind and did well with it. He would like to switch over his DME company to [facility] as his son's machine is through them.  The patient's allergies, current medications, family history, past medical history, past social history, past surgical history and problem list were reviewed and updated as appropriate.   Previously (copied from previous notes for reference):   I saw him on 03/03/2015, at which time he reported feeling much better. He felt that his sleep quality was much better and his nocturia had improved. He woke up better rested with more daytime energy. He was working on weight loss. He was drinking more water.   I reviewed his CPAP compliance data from 08/01/2015 through 08/30/2015 which is a total of 30 days during which time  his machine every night with percent used days greater than 4 hours at 90%, indicating excellent compliance with an average usage of 5 hours and 58 minutes, residual AHI reasonable at 3.7 per hour, leak at times high with the 95th percentile at 47.7 L/m on a pressure of 10 cm.      I first met him on 09/09/2014 at the request of his primary care physician, at which time the patient reported snoring, daytime somnolence, and witnessed apneas. He also endorsed recent weight gain in the context of recent increase in stress. He had nocturia 1 usually. I invited him back for sleep study. He had a split-night sleep study on 10/07/2014 and went over his test results with him in detail today. His baseline sleep efficiency was 38.1% with a latency to sleep of 27.5 minutes and wake after sleep onset of 44 minutes with severe sleep fragmentation noted. He had a markedly elevated arousal index. He had an increased percentage of light stage sleep and absence of slow-wave and REM sleep. He had mild to moderate snoring. Average oxygen saturation was 90%, nadir was 83%. He had a total AHI of 81.8 per hour. He was then titrated on CPAP. Sleep efficiency was 63.5%. His arousal index improved. He had an increased percentage of REM sleep during the treatment portion of the study. Average oxygen saturation was 93%, nadir was 82%. He was titrated from 5-11 cm with a reduction of his AHI to 1.7 per hour at the final pressure. Supine REM sleep was not achieved.  The patient indicated that he did not sleep on his back. There were no significant periodic leg movements before or after his CPAP. Based on the test results are prescribed CPAP therapy for him. I prescribed a pressure of 11 cm, but he appears to have been on 8 cm all along.     I reviewed the patient's CPAP compliance data from 01/01/2015 through 01/30/2015 which is a total of 30 days during which time he used his machine every night with percent used days greater than 4 hours  at 87%, indicating very good compliance with an average usage for all days of 5 hours and 25 minutes on a pressure of 8 cm with EPR of 1. Residual AHI acceptable at 4.4 per hour but leak at times high with the 95th percentile at 41.8 L/m.    In the interim, we contacted him with his compliance data results and requested that he meet with his DME company for adjusting of his mask. I also prescribed a nose mask with chinstrap for him and we faxed the order to his DME company.   I reviewed his CPAP compliance data from 01/31/2015 through 03/01/2015 which is a total of 30 days during which time he used his machine 29 days with percent used days greater than 4 hours of 73%, indicating adequate compliance with an average usage of 5 hours and 1 minute, residual AHI slightly suboptimal at 6.4 per hour and leak acceptable with the 95th percentile at 19.5 L/m on a pressure of 8 cm.   He reports snoring and witnessed apneas per wife as well as daytime somnolence. His ESS is 10-12. He endorses stress and recent weight gain. He does not nap. He denies morning headaches or RLS symptoms. Snoring is loud. He has to go to the bathroom once per night, usually when his dog wakes him up in the early morning hours. He is not known to kick in his sleep. He denies nocturnal acid reflux symptoms. He does have nasal congestion which seems to be seasonal and occasionally has postnasal drip.   He goes to bed around midnight and typically falls asleep okay. He wakes up once per night on average and goes to the bathroom once per night on average. He estimates that he gets about 5-6 hours of sleep per night. He does wake up rested but has daytime tiredness as the day progresses. He is a Psychiatrist. Rarely will he turned on his sides. He does not sleep on his back. He drinks a lot of caffeine, approximately 10 caffeine containing beverages per day including 6 cups of coffee and unsweet tea. He does not drink very much water at all.  He may have a family history of obstructive sleep apnea in his mother.   He denies cataplexy, sleep paralysis, hypnagogic or hypnopompic hallucinations, or sleep attacks. He does not report any vivid dreams, nightmares, dream enactments, or parasomnias, such as sleep talking or sleep walking. The patient has not had a sleep study or a home sleep test.   He does not smoke. He drinks alcohol approximately 3-5 glasses of wine per week, sometimes in the form of beer in the summer time.   His Past Medical History Is Significant For: Past Medical History:  Diagnosis Date  . Carpal tunnel syndrome, right   . Hernia    umbilical hernia  . Hyperlipidemia    no meds /diet   . Hypertension   . Obesity   . OSA on CPAP   .  Pneumonia     His Past Surgical History Is Significant For: Past Surgical History:  Procedure Laterality Date  . APPENDECTOMY  2005  . EYE SURGERY  1967-1968   [facility]  . HERNIA REPAIR  2004  . WISDOM TOOTH EXTRACTION      His Family History Is Significant For: Family History  Problem Relation Age of Onset  . Diabetes Other   . Thyroid disease Other   . Hypertension Sister   . COPD Father   . Hypertension Father   . Diabetes type II Unknown        GRANDFATHER  . Coronary artery disease Unknown        GRANDMOTHER  . Aneurysm Mother        BRAIN  . Diabetes Paternal Grandmother   . Diabetes Son     His Social History Is Significant For: Social History   Socioeconomic History  . Marital status: Married    Spouse name: Otila Kluver  . Number of children: 2  . Years of education: 39  . Highest education level: Not on file  Occupational History    Comment: [employer]  Social Needs  . Financial resource strain: Not on file  . Food insecurity:    Worry: Not on file    Inability: Not on file  . Transportation needs:    Medical: Not on file    Non-medical: Not on file  Tobacco Use  . Smoking status: Never Smoker  . Smokeless tobacco: Current User     Types: Chew  . Tobacco comment: Chews daily  Substance and Sexual Activity  . Alcohol use: Yes    Alcohol/week: 1.2 oz    Types: 2 Standard drinks or equivalent per week    Comment: wine 3-5 glasses weekly  . Drug use: No  . Sexual activity: Not on file  Lifestyle  . Physical activity:    Days per week: Not on file    Minutes per session: Not on file  . Stress: Not on file  Relationships  . Social connections:    Talks on phone: Not on file    Gets together: Not on file    Attends religious service: Not on file    Active member of club or organization: Not on file    Attends meetings of clubs or organizations: Not on file    Relationship status: Not on file  Other Topics Concern  . Not on file  Social History Narrative   Right handed and consumes 3-5 cups of caffeine daily    His Allergies Are:  Allergies  Allergen Reactions  . Lisinopril Other (See Comments)  :   His Current Medications Are:  Outpatient Encounter Medications as of 03/26/2018  Medication Sig  . amLODipine (NORVASC) 2.5 MG tablet Take 2.5 mg by mouth daily.  . Aspirin-Acetaminophen (GOODY BODY PAIN) 500-325 MG PACK Take 1 packet by mouth every 6 (six) hours as needed. Reported on 04/17/2016  . hydrochlorothiazide (HYDRODIURIL) 25 MG tablet Take 25 mg by mouth daily.  Marland Kitchen ibuprofen (ADVIL,MOTRIN) 200 MG tablet Take 400 mg by mouth every 6 (six) hours as needed. For pain  . Multiple Vitamins-Minerals (MULTIVITAMIN WITH MINERALS) tablet Take 1 tablet by mouth daily.  . pravastatin (PRAVACHOL) 40 MG tablet Take 40 mg by mouth daily. Reported on 04/17/2016  . [DISCONTINUED] atenolol (TENORMIN) 100 MG tablet Reported on 04/03/2016  . [DISCONTINUED] Cyanocobalamin (B-12 PO) Take 1 tablet by mouth daily.  . [DISCONTINUED] Wheat Dextrin (BENEFIBER PO)  Take by mouth daily.   No facility-administered encounter medications on file as of 03/26/2018.   :  Review of Systems:  Out of a complete 14 point review of systems,  all are reviewed and negative with the exception of these symptoms as listed below: Review of Systems  Neurological:       Pt presents today to discuss his cpap. Pt reports that his cpap is going well.    Objective:  Neurological Exam  Physical Exam Physical Examination:   Vitals:   03/26/18 1107  BP: (!) 162/105  Pulse: 71    General Examination: The patient is a very pleasant 57 y.o. male in no acute distress. He appears well-developed and well-nourished and well groomed.   HEENT: Normocephalic, atraumatic, pupils are equal, round and reactive to light and accommodation. Extraocular tracking is good without limitation to gaze excursion or nystagmus noted. Normal smooth pursuit is noted. Hearing is grossly intact. Face is symmetric with normal facial animation and normal facial sensation. Speech is clear with no dysarthria noted. There is no hypophonia. There is no lip, neck/head, jaw or voice tremor. Neck is supple with full range of passive and active motion. Oropharynx exam reveals: mild mouth dryness, good dental hygiene and marked airway crowding. Tongue protrudes centrally and palate elevates symmetrically.    Chest: Clear to auscultation without wheezing, rhonchi or crackles noted.  Heart: S1+S2+0, regular and normal without murmurs, rubs or gallops noted.   Abdomen: Soft, non-tender and non-distended with normal bowel sounds appreciated on auscultation.  Extremities: There is no obvious change.   Skin: Warm and dry without trophic changes noted. There are no varicose veins, but he has spider veins and chronic appearing mild discoloration in the distal lower extremities bilaterally, all stable.  Musculoskeletal: exam reveals no obvious joint deformities, tenderness or joint swelling or erythema.   Neurologically:  Mental status: The patient is awake, alert and oriented in all 4 spheres. His immediate and remote memory, attention, language skills and fund of knowledge  are appropriate. There is no evidence of aphasia, agnosia, apraxia or anomia. Speech is clear with normal prosody and enunciation. Thought process is linear. Mood is normal and affect is normal.  Cranial nerves II - XII are as described above under HEENT exam.  Motor exam: Normal bulk, strength and tone is noted. There is no tremor. Fine motor skills and coordination: intact grossly. There is no truncal or gait ataxia.  Sensory exam: intact to light touch.  Gait, station and balance: He stands easily. No veering to one side is noted. No leaning to one side is noted. Posture is age-appropriate and stance is narrow based. Gait shows normal stride length and normal pace. No problems turning are noted.                Assessment and Plan:  In summary, REESE STOCKMAN is a very pleasant 57 year old male with an underlying medical history of morbid obesity, umbilical hernia, hypertension, carpal tunnel syndrome, hyperlipidemia and pneumonia in 2013, who presents for follow-up consultation of his severe obstructive sleep apnea diagnosed via split-night sleep study in November 2015, well established on CPAP therapy at a pressure of 10 cm with good results and ongoing good compliance. He is again encouraged to use CPAP regularly and not to skip any nights. We talked about the importance of weight management, he is on 2 blood pressure medications, he has an upcoming appointment with his PCP. He's overall doing well. I commended him for  his treatment adherence. He would like to switch over his DME to [employer] as his son is established with them now and it would be easier both have the same DME companies. He has started using the same type of full facemask that his son uses, and it is very tolerable. He is motivated to continue with treatment, I suggested a one-year checkup routinely. I placed an order for his CPAP supplies and we will request a change in DME provider. I answered all his questions today and he was in  agreement. I spent 15 minutes in total face-to-face time with the patient, more than 50% of which was spent in counseling and coordination of care, reviewing test results, reviewing medication and discussing or reviewing the diagnosis of OSA, its prognosis and treatment options. Pertinent laboratory and imaging test results that were available during this visit with the patient were reviewed by me and considered in my medical decision making (see chart for details).

## 2018-04-17 DIAGNOSIS — G4733 Obstructive sleep apnea (adult) (pediatric): Secondary | ICD-10-CM | POA: Diagnosis not present

## 2018-04-17 DIAGNOSIS — R7301 Impaired fasting glucose: Secondary | ICD-10-CM | POA: Diagnosis not present

## 2018-04-17 DIAGNOSIS — Z0000 Encounter for general adult medical examination without abnormal findings: Secondary | ICD-10-CM | POA: Diagnosis not present

## 2018-04-17 DIAGNOSIS — Z1389 Encounter for screening for other disorder: Secondary | ICD-10-CM | POA: Diagnosis not present

## 2018-04-17 DIAGNOSIS — J3089 Other allergic rhinitis: Secondary | ICD-10-CM | POA: Diagnosis not present

## 2018-04-17 DIAGNOSIS — K429 Umbilical hernia without obstruction or gangrene: Secondary | ICD-10-CM | POA: Diagnosis not present

## 2018-04-17 DIAGNOSIS — I10 Essential (primary) hypertension: Secondary | ICD-10-CM | POA: Diagnosis not present

## 2018-04-17 DIAGNOSIS — E7849 Other hyperlipidemia: Secondary | ICD-10-CM | POA: Diagnosis not present

## 2018-04-17 DIAGNOSIS — F5221 Male erectile disorder: Secondary | ICD-10-CM | POA: Diagnosis not present

## 2018-04-17 MED FILL — AMLODIPINE BESYLATE 5 MG TA: 5 | 90 days supply | Qty: 90 | Fill #0

## 2018-05-04 MED FILL — BREO ELLIPTA 100-25 MCG INH: 100-25 | 30 days supply | Qty: 60 | Fill #4

## 2018-05-05 MED FILL — HYDROCHLOROTHIAZIDE 25 MG T: 25 | 90 days supply | Qty: 90 | Fill #2

## 2018-05-07 MED FILL — PROAIR RESPICLICK INHAL PWD: 108 (90 BAS | 17 days supply | Qty: 1 | Fill #0

## 2018-06-16 MED FILL — PRAVASTATIN NA 40 MG TAB: 40 | 90 days supply | Qty: 90 | Fill #1

## 2018-07-08 MED FILL — AMLODIPINE BESYLATE 5 MG TA: 5 | 90 days supply | Qty: 90 | Fill #1

## 2018-08-05 DIAGNOSIS — E7849 Other hyperlipidemia: Secondary | ICD-10-CM | POA: Diagnosis not present

## 2018-08-05 DIAGNOSIS — M25561 Pain in right knee: Secondary | ICD-10-CM | POA: Diagnosis not present

## 2018-08-05 DIAGNOSIS — G4733 Obstructive sleep apnea (adult) (pediatric): Secondary | ICD-10-CM | POA: Diagnosis not present

## 2018-08-05 DIAGNOSIS — R7301 Impaired fasting glucose: Secondary | ICD-10-CM | POA: Diagnosis not present

## 2018-08-05 DIAGNOSIS — Z6841 Body Mass Index (BMI) 40.0 and over, adult: Secondary | ICD-10-CM | POA: Diagnosis not present

## 2018-08-05 DIAGNOSIS — I10 Essential (primary) hypertension: Secondary | ICD-10-CM | POA: Diagnosis not present

## 2018-08-05 MED FILL — AMLODIPINE BESYLATE 10 MG T: 10 | 90 days supply | Qty: 90 | Fill #0

## 2018-08-07 MED FILL — HYDROCHLOROTHIAZIDE 25 MG T: 25 | 90 days supply | Qty: 90 | Fill #0

## 2018-08-09 MED FILL — BREO ELLIPTA 100-25 MCG INH: 100-25 | 30 days supply | Qty: 60 | Fill #5

## 2018-08-28 DIAGNOSIS — Z23 Encounter for immunization: Secondary | ICD-10-CM | POA: Diagnosis not present

## 2018-10-01 MED FILL — FLUTICASONE PROP 50 MCG SPR: 50 | 30 days supply | Qty: 16 | Fill #0

## 2018-10-01 MED FILL — CEFDINIR 300 MG CAPSULE: 300 | 10 days supply | Qty: 20 | Fill #0

## 2018-10-09 DIAGNOSIS — G4733 Obstructive sleep apnea (adult) (pediatric): Secondary | ICD-10-CM | POA: Diagnosis not present

## 2018-10-29 MED FILL — BREO ELLIPTA 100-25 MCG INH: 100-25 | 30 days supply | Qty: 60 | Fill #0

## 2018-11-05 MED FILL — PROAIR RESPICLICK INHAL PWD: 108 (90 BAS | 17 days supply | Qty: 1 | Fill #1

## 2018-11-05 MED FILL — AMLODIPINE BESYLATE 10 MG T: 10 | 90 days supply | Qty: 90 | Fill #1

## 2018-11-05 MED FILL — HYDROCHLOROTHIAZIDE 25 MG T: 25 | 90 days supply | Qty: 90 | Fill #1

## 2018-12-05 DIAGNOSIS — Z6841 Body Mass Index (BMI) 40.0 and over, adult: Secondary | ICD-10-CM | POA: Diagnosis not present

## 2018-12-05 DIAGNOSIS — L02212 Cutaneous abscess of back [any part, except buttock]: Secondary | ICD-10-CM | POA: Diagnosis not present

## 2018-12-05 MED FILL — DOXYCYCLINE HYCLATE 100 MG: 100 | 7 days supply | Qty: 14 | Fill #0

## 2018-12-08 ENCOUNTER — Emergency Department (HOSPITAL_COMMUNITY)
Admission: EM | Admit: 2018-12-08 | Discharge: 2018-12-08 | Disposition: A | Discharge status: Home / Self Care | Payer: 59 | Attending: Emergency Medicine | Admitting: Emergency Medicine

## 2018-12-08 ENCOUNTER — Other Ambulatory Visit: Payer: Self-pay

## 2018-12-08 ENCOUNTER — Encounter (HOSPITAL_COMMUNITY): Payer: Self-pay

## 2018-12-08 DIAGNOSIS — I10 Essential (primary) hypertension: Secondary | ICD-10-CM | POA: Diagnosis not present

## 2018-12-08 DIAGNOSIS — L723 Sebaceous cyst: Secondary | ICD-10-CM | POA: Insufficient documentation

## 2018-12-08 DIAGNOSIS — F17220 Nicotine dependence, chewing tobacco, uncomplicated: Secondary | ICD-10-CM | POA: Insufficient documentation

## 2018-12-08 DIAGNOSIS — L089 Local infection of the skin and subcutaneous tissue, unspecified: Secondary | ICD-10-CM | POA: Diagnosis not present

## 2018-12-08 DIAGNOSIS — Z79899 Other long term (current) drug therapy: Secondary | ICD-10-CM | POA: Diagnosis not present

## 2018-12-08 DIAGNOSIS — L02212 Cutaneous abscess of back [any part, except buttock]: Secondary | ICD-10-CM | POA: Diagnosis not present

## 2018-12-08 MED ORDER — LIDOCAINE HCL 2 % IJ SOLN
20.0000 mL | Freq: Once | INTRAMUSCULAR | Status: AC
  Administered 2018-12-08: 400 mg
  Filled 2018-12-08: qty 20

## 2018-12-08 NOTE — ED Provider Notes (Signed)
[facility]-EMERGENCY DEPT Provider Note   CSN: [id number] Arrival date & time: 12/08/18  1507     History   Chief Complaint Chief Complaint  Patient presents with  . Abscess    HPI Jake Martin is a 58 y.o. male with hx of HTN who presents to the ED with c/o abscess to his upper back. The symptoms started a week ago and have gotten worse. Patient was prescribed antibiotics and warm compresses by his PCP last week but not helping. Patient reports fevers x 3 nights but none today. Patient called his PCP today and was told because of the size of the abscess he needed to come to the ED.  HPI  Past Medical History:  Diagnosis Date  . Carpal tunnel syndrome, right   . Hernia    umbilical hernia  . Hyperlipidemia    no meds /diet   . Hypertension   . Obesity   . OSA on CPAP   . Pneumonia     Patient Active Problem List   Diagnosis Date Noted  . Dyspnea 12/11/2012  . Snoring 12/11/2012  . Morbid obesity with BMI of 40.0-44.9, adult (HCC) 12/11/2012    Past Surgical History:  Procedure Laterality Date  . APPENDECTOMY  2005  . EYE SURGERY  1967-1968   [facility]  . HERNIA REPAIR  2004  . WISDOM TOOTH EXTRACTION          Home Medications    Prior to Admission medications   Medication Sig Start Date End Date Taking? Authorizing Provider  amLODipine (NORVASC) 2.5 MG tablet Take 2.5 mg by mouth daily.    [provider]  Aspirin-Acetaminophen (GOODY BODY PAIN) 500-325 MG PACK Take 1 packet by mouth every 6 (six) hours as needed. Reported on 04/17/2016    [provider]  hydrochlorothiazide (HYDRODIURIL) 25 MG tablet Take 25 mg by mouth daily.    [provider]  ibuprofen (ADVIL,MOTRIN) 200 MG tablet Take 400 mg by mouth every 6 (six) hours as needed. For pain    [provider]  Multiple Vitamins-Minerals (MULTIVITAMIN WITH MINERALS) tablet Take 1 tablet by mouth daily.    [provider]  pravastatin  (PRAVACHOL) 40 MG tablet Take 40 mg by mouth daily. Reported on 04/17/2016    [provider]    Family History Family History  Problem Relation Age of Onset  . Hypertension Sister   . COPD Father   . Hypertension Father   . Aneurysm Mother        BRAIN  . Diabetes Other   . Thyroid disease Other   . Diabetes type II Other        GRANDFATHER  . Coronary artery disease Other        GRANDMOTHER  . Diabetes Paternal Grandmother   . Diabetes Son     Social History Social History   Tobacco Use  . Smoking status: Never Smoker  . Smokeless tobacco: Current User    Types: Chew  . Tobacco comment: Chews daily  Substance Use Topics  . Alcohol use: Yes    Alcohol/week: 2.0 standard drinks    Types: 2 Standard drinks or equivalent per week    Comment: wine 3-5 glasses weekly  . Drug use: No     Allergies   Lisinopril   Review of Systems Review of Systems  Skin: Positive for color change and wound.  All other systems reviewed and are negative.    Physical Exam Updated Vital  Signs BP (!) 161/102 (BP Location: Left Arm)   Pulse 77   Temp 98.5 F (36.9 C) (Oral)   Resp 18   Ht 5\' 11"  (1.803 m)   Wt (!) 144.7 kg   SpO2 96%   BMI 44.49 kg/m   Physical Exam Vitals signs and nursing note reviewed.  Constitutional:      General: He is not in acute distress.    Appearance: He is well-developed.  HENT:     Head: Normocephalic.     Nose: Nose normal.     Mouth/Throat:     Mouth: Mucous membranes are moist.  Eyes:     Conjunctiva/sclera: Conjunctivae normal.  Neck:     Musculoskeletal: Neck supple.  Cardiovascular:     Rate and Rhythm: Normal rate.  Pulmonary:     Effort: Pulmonary effort is normal.  Musculoskeletal: Normal range of motion.     Thoracic back: He exhibits tenderness. He exhibits normal range of motion.       Back:     Comments: 4 cm raised tender area with erythema. The area is firm.  Skin:    General: Skin is warm and dry.    Neurological:     Mental Status: He is alert and oriented to person, place, and time.  Psychiatric:        Mood and Affect: Mood normal.      ED Treatments / Results  Labs (all labs ordered are listed, but only abnormal results are displayed) Labs Reviewed - No data to display  Radiology No results found.  Procedures .Marland Kitchen.Incision and Drainage Date/Time: 12/08/2018 6:24 PM Performed by: Janne NapoleonNeese, Tomi Grandpre M, NP Authorized by: Janne NapoleonNeese, Alaina Donati M, NP   Consent:    Consent obtained:  Verbal   Consent given by:  Patient   Risks discussed:  Bleeding and incomplete drainage   Alternatives discussed:  No treatment and alternative treatment Location:    Type:  Cyst (infected cyst)   Location:  Trunk   Trunk location:  Back Pre-procedure details:    Skin preparation:  Betadine Anesthesia (see MAR for exact dosages):    Anesthesia method:  Local infiltration   Local anesthetic:  Lidocaine 2% w/o epi Procedure type:    Complexity:  Complex Procedure details:    Needle aspiration: no     Incision types:  Single straight   Incision depth:  Dermal   Scalpel blade:  11   Wound management:  Probed and deloculated and irrigated with saline   Drainage:  Purulent and bloody (white thick cheesy )   Drainage amount:  Moderate   Packing materials:  1/2 in iodoform gauze Post-procedure details:    Patient tolerance of procedure:  Tolerated well, no immediate complications Comments:     Area appears as an infected cyst.    (including critical care time)  Medications Ordered in ED Medications  lidocaine (XYLOCAINE) 2 % (with pres) injection 400 mg (400 mg Infiltration Given 12/08/18 1747)     Initial Impression / Assessment and Plan / ED Course  I have reviewed the triage vital signs and the nursing notes. 58 y.o. male here with raised tender area to the upper back stable for d/c after I&D. Patient to continue his antibiotics and apply warm compresses. Instructed re wound check and packing  removal in 2 days. Discussed with the patient that there was purulent drainage from the area but only part of the cyst removed. Patient will call dermatology to discuss removal of the cyst. He  will return here as needed.  Final Clinical Impressions(s) / ED Diagnoses   Final diagnoses:  Infected sebaceous cyst    ED Discharge Orders    None       Kerrie Buffalo [place], [place] 12/08/18 Alexia Freestone, MD 12/10/18 1319

## 2018-12-08 NOTE — Discharge Instructions (Signed)
Continue your antibiotics and ibuprofen. Apply warm wet compresses to the area several times a day. Remove the packing in 2 days. Follow up with your doctor or the dermatologist for further treatment.

## 2018-12-08 NOTE — ED Triage Notes (Signed)
Patient has an abscess on mid upper back x 1 week. Patient states he saw his PCP and ws given antibiotics and warm compresses. Patient states he had fevers x 3 nights in a row,but none now.

## 2019-01-13 MED FILL — BREO ELLIPTA 100-25 MCG INH: 100-25 | 30 days supply | Qty: 60 | Fill #1

## 2019-01-21 DIAGNOSIS — G4733 Obstructive sleep apnea (adult) (pediatric): Secondary | ICD-10-CM | POA: Diagnosis not present

## 2019-02-04 MED FILL — AMLODIPINE BESYLATE 10 MG T: 10 | 90 days supply | Qty: 90 | Fill #2

## 2019-02-04 MED FILL — HYDROCHLOROTHIAZIDE 25 MG T: 25 | 90 days supply | Qty: 90 | Fill #2

## 2019-02-05 MED FILL — BREO ELLIPTA 100-25 MCG INH: 100-25 | 30 days supply | Qty: 60 | Fill #0

## 2019-03-20 MED FILL — PROAIR RESPICLICK INHAL PWD: 108 (90 BAS | 17 days supply | Qty: 1 | Fill #2

## 2019-03-21 MED FILL — BREO ELLIPTA 100-25 MCG INH: 100-25 | 30 days supply | Qty: 60 | Fill #1

## 2019-03-23 ENCOUNTER — Telehealth: Payer: Self-pay | Admitting: Neurology

## 2019-03-23 NOTE — Telephone Encounter (Signed)
Due to current COVID 19 pandemic, our office is severely reducing in office visits until further notice, in order to minimize the risk to our patients and healthcare providers.   Called patient to offer virtual visit for 5/11 appointment. Patient accepted and verbalized understanding of the doxy.me process. Patient is aware that he will receive an e-mail with Dr. Teofilo Pod doxy link as well as directions. I advised patient that he will receive calls from RN And front office staff. Patient verbalized understanding.  Pt understands that although there may be some limitations with this type of visit, we will take all precautions to reduce any security or privacy concerns.  Pt understands that this will be treated like an in office visit and we will file with pt's insurance, and there may be a patient responsible charge related to this service.

## 2019-03-25 ENCOUNTER — Encounter: Payer: Self-pay | Admitting: Neurology

## 2019-03-26 NOTE — Addendum Note (Signed)
Addended by: Geronimo Running A on: 03/26/2019 08:58 AM   Modules accepted: Orders

## 2019-03-26 NOTE — Telephone Encounter (Signed)
I called pt. Pt's meds, allergies, and PMH were updated.  He reports that his cpap is going well. Pt has no questions prior to his virtual visit on Monday.

## 2019-03-30 ENCOUNTER — Other Ambulatory Visit: Payer: Self-pay

## 2019-03-30 ENCOUNTER — Encounter: Payer: Self-pay | Admitting: Neurology

## 2019-03-30 ENCOUNTER — Ambulatory Visit (INDEPENDENT_AMBULATORY_CARE_PROVIDER_SITE_OTHER): Payer: 59 | Admitting: Neurology

## 2019-03-30 DIAGNOSIS — G4733 Obstructive sleep apnea (adult) (pediatric): Secondary | ICD-10-CM | POA: Diagnosis not present

## 2019-03-30 DIAGNOSIS — Z9989 Dependence on other enabling machines and devices: Secondary | ICD-10-CM

## 2019-03-30 NOTE — Progress Notes (Signed)
Interim history:  Jake Martin is a 58 year old right-handed gentleman with an underlying medical history of morbid obesity, umbilical hernia, hypertension, carpal tunnel syndrome, hyperlipidemia and pneumonia in 2013, who presents for a virtual, video based follow-up appointment via [email or web address] for which he has been established on CPAP therapy, he presents for his yearly checkup. The patient is unaccompanied today and joins via laptop from his home. I am located in my office.  I last saw him on 03/26/2018, at which time he was compliant with his CPAP and doing well.  He was advised to routinely follow-up in 1 year.    Today, 03/30/2019: Please also see below for virtual visit documentation.    I reviewed his CPAP compliance data from 02/24/2019 through 03/25/2019 which is a total of 30 days, during which time he used his machine every night with percent used days greater than 4 hours at 97%, indicating excellent compliance, average usage of 6 hours and 36 minutes, residual AHI at goal at 2/h, leak acceptable with a 95th percentile at 7.2 L/min on a pressure of 10 cm.      The patient's allergies, current medications, family history, past medical history, past social history, past surgical history and problem list were reviewed and updated as appropriate.    Previously (copied from previous notes for reference):    I saw him on 09/01/2015, at which time he was compliant with CPAP therapy and reported doing well. He was advised to follow-up routinely in one year.  I reviewed his CPAP compliance data from 02/23/2018 through 03/24/2018, which is a total of 30 days, during which time he used his CPAP every night with percent used days greater than 4 hours at 90%, indicating excellent compliance with an average usage of 5 hours and 38 minutes, residual AHI at goal at 2.4 per hour, leak acceptable with the 95th percentile at 15.6 L/m on a pressure of 10 cm.    I saw him on 03/03/2015, at which time he reported  feeling much better. He felt that his sleep quality was much better and his nocturia had improved. He woke up better rested with more daytime energy. He was working on weight loss. He was drinking more water.   I reviewed his CPAP compliance data from 08/01/2015 through 08/30/2015 which is a total of 30 days during which time his machine every night with percent used days greater than 4 hours at 90%, indicating excellent compliance with an average usage of 5 hours and 58 minutes, residual AHI reasonable at 3.7 per hour, leak at times high with the 95th percentile at 47.7 L/m on a pressure of 10 cm.      I first met him on 09/09/2014 at the request of his primary care physician, at which time the patient reported snoring, daytime somnolence, and witnessed apneas. He also endorsed recent weight gain in the context of recent increase in stress. He had nocturia 1 usually. I invited him back for sleep study. He had a split-night sleep study on 10/07/2014 and went over his test results with him in detail today. His baseline sleep efficiency was 38.1% with a latency to sleep of 27.5 minutes and wake after sleep onset of 44 minutes with severe sleep fragmentation noted. He had a markedly elevated arousal index. He had an increased percentage of light stage sleep and absence of slow-wave and REM sleep. He had mild to moderate snoring. Average oxygen saturation was 90%, nadir was 83%. He had a total AHI of  81.8 per hour. He was then titrated on CPAP. Sleep efficiency was 63.5%. His arousal index improved. He had an increased percentage of REM sleep during the treatment portion of the study. Average oxygen saturation was 93%, nadir was 82%. He was titrated from 5-11 cm with a reduction of his AHI to 1.7 per hour at the final pressure. Supine REM sleep was not achieved. The patient indicated that he did not sleep on his back. There were no significant periodic leg movements before or after his CPAP. Based on the test  results are prescribed CPAP therapy for him. I prescribed a pressure of 11 cm, but he appears to have been on 8 cm all along.     I reviewed the patient's CPAP compliance data from 01/01/2015 through 01/30/2015 which is a total of 30 days during which time he used his machine every night with percent used days greater than 4 hours at 87%, indicating very good compliance with an average usage for all days of 5 hours and 25 minutes on a pressure of 8 cm with EPR of 1. Residual AHI acceptable at 4.4 per hour but leak at times high with the 95th percentile at 41.8 L/m.    In the interim, we contacted him with his compliance data results and requested that he meet with his DME company for adjusting of his mask. I also prescribed a nose mask with chinstrap for him and we faxed the order to his DME company.   I reviewed his CPAP compliance data from 01/31/2015 through 03/01/2015 which is a total of 30 days during which time he used his machine 29 days with percent used days greater than 4 hours of 73%, indicating adequate compliance with an average usage of 5 hours and 1 minute, residual AHI slightly suboptimal at 6.4 per hour and leak acceptable with the 95th percentile at 19.5 L/m on a pressure of 8 cm.   He reports snoring and witnessed apneas per wife as well as daytime somnolence. His ESS is 10-12. He endorses stress and recent weight gain. He does not nap. He denies morning headaches or RLS symptoms. Snoring is loud. He has to go to the bathroom once per night, usually when his dog wakes him up in the early morning hours. He is not known to kick in his sleep. He denies nocturnal acid reflux symptoms. He does have nasal congestion which seems to be seasonal and occasionally has postnasal drip.   He goes to bed around midnight and typically falls asleep okay. He wakes up once per night on average and goes to the bathroom once per night on average. He estimates that he gets about 5-6 hours of sleep per night.  He does wake up rested but has daytime tiredness as the day progresses. He is a Psychiatrist. Rarely will he turned on his sides. He does not sleep on his back. He drinks a lot of caffeine, approximately 10 caffeine containing beverages per day including 6 cups of coffee and unsweet tea. He does not drink very much water at all. He may have a family history of obstructive sleep apnea in his mother.   He denies cataplexy, sleep paralysis, hypnagogic or hypnopompic hallucinations, or sleep attacks. He does not report any vivid dreams, nightmares, dream enactments, or parasomnias, such as sleep talking or sleep walking. The patient has not had a sleep study or a home sleep test.   He does not smoke. He drinks alcohol approximately 3-5 glasses of wine per  week, sometimes in the form of beer in the summer time.  His Past Medical History Is Significant For: Past Medical History:  Diagnosis Date   Carpal tunnel syndrome, right    Hernia    umbilical hernia   Hyperlipidemia    no meds /diet    Hypertension    Obesity    OSA on CPAP    Pneumonia     His Past Surgical History Is Significant For: Past Surgical History:  Procedure Laterality Date   APPENDECTOMY  2005   EYE SURGERY  [phone]   [facility]   HERNIA REPAIR  2004   WISDOM TOOTH EXTRACTION      His Family History Is Significant For: Family History  Problem Relation Age of Onset   Hypertension Sister    COPD Father    Hypertension Father    Aneurysm Mother        BRAIN   Diabetes Other    Thyroid disease Other    Diabetes type II Other        GRANDFATHER   Coronary artery disease Other        GRANDMOTHER   Diabetes Paternal Grandmother    Diabetes Son     His Social History Is Significant For: Social History   Socioeconomic History   Marital status: Married    Spouse name: Otila Kluver   Number of children: 2   Years of education: 15   Highest education level: Not on file  Occupational History     Comment: Silver Engineer, maintenance (IT) strain: Not on file   Food insecurity:    Worry: Not on file    Inability: Not on file   Transportation needs:    Medical: Not on file    Non-medical: Not on file  Tobacco Use   Smoking status: Never Smoker   Smokeless tobacco: Current User    Types: Chew   Tobacco comment: Chews daily  Substance and Sexual Activity   Alcohol use: Yes    Alcohol/week: 2.0 standard drinks    Types: 2 Standard drinks or equivalent per week    Comment: wine 3-5 glasses weekly   Drug use: No   Sexual activity: Not on file  Lifestyle   Physical activity:    Days per week: Not on file    Minutes per session: Not on file   Stress: Not on file  Relationships   Social connections:    Talks on phone: Not on file    Gets together: Not on file    Attends religious service: Not on file    Active member of club or organization: Not on file    Attends meetings of clubs or organizations: Not on file    Relationship status: Not on file  Other Topics Concern   Not on file  Social History Narrative   Right handed and consumes 3-5 cups of caffeine daily    His Allergies Are:  Allergies  Allergen Reactions   Lisinopril Other (See Comments)  :   His Current Medications Are:  Outpatient Encounter Medications as of 03/30/2019  Medication Sig   amLODipine (NORVASC) 2.5 MG tablet Take 2.5 mg by mouth daily.   Aspirin-Acetaminophen (GOODY BODY PAIN) 500-325 MG PACK Take 1 packet by mouth every 6 (six) hours as needed. Reported on 04/17/2016   hydrochlorothiazide (HYDRODIURIL) 25 MG tablet Take 25 mg by mouth daily.   ibuprofen (ADVIL,MOTRIN) 200 MG tablet Take 400 mg by mouth every  6 (six) hours as needed. For pain   Multiple Vitamins-Minerals (MULTIVITAMIN WITH MINERALS) tablet Take 1 tablet by mouth daily.   No facility-administered encounter medications on file as of 03/30/2019.   :  Review of Systems:  Out of a  complete 14 point review of systems, all are reviewed and negative with the exception of these symptoms as listed below:  Virtual Visit via Video Note on @ TODAY@  I connected with@ on 03/30/19 at  8:30 AM EDT by a video enabled telemedicine application and verified that I am speaking with the correct person using two identifiers.   I discussed the limitations of evaluation and management by telemedicine and the availability of in person appointments. The patient expressed understanding and agreed to proceed.  History of Present Illness: He reports doing well, Abscess has healed up, no new issues thankfully.  He had some change in his blood pressure medication recently, has not checked it lately. He uses a fullface mask, he is up-to-date with his supplies.  He may be eligible for a new machine this year per Discussion with her DME provider he states.     Observations/Objective: The most recent vital signs available for my review in his chart are from 12/08/2018: Blood pressure 161/102, pulse 77, temperature 98.5, respirations 18, weight 319 pounds for a BMI of 44.49.   On examination, he is in no acute distress, pleasant and conversant, face is symmetric with normal facial animation.  Language skills and comprehension intact.  Speech is clear without dysarthria, hypophonia or voice tremor noted. Airway examination reveals mild mouth dryness, otherwise stable findings. Extraocular movements are well preserved in all directions without nystagmus.  He wears eyeglasses.  Hearing is grossly intact.  Shoulder height equal, upper body mobility and upper extremity coordination grossly intact.   Assessment and Plan: In summary, Jake Martin is a very pleasant 58 year old male with an underlying medical history of morbid obesity, umbilical hernia, hypertension, carpal tunnel syndrome, hyperlipidemia and pneumonia in 2013,who presents for A virtual, video based follow-up consultation of his severe  obstructive sleep apnea diagnosed via split-night sleep study in November 2015. He is well-established on CPAP therapy and fully compliant with it.  He is commended for his treatment adherence.  He may be eligible for a new machine by end of this year. He's overall doing well and I suggested a one-year checkup routinely. I placed an order for his CPAP supplies with his DME provider. I answered all his questions today and he was in agreement.  Follow Up Instructions: 1. Continue using CPAP regularly with full compliance, patient is commended for treatment adherence. 2. Follow-up yearly, with NP next time.  3. CPAP supply order placed, will fax to [place]. 4. Call or email through My Chart for any interim questions or concerns.    I discussed the assessment and treatment plan with the patient. The patient was provided an opportunity to ask questions and all were answered. The patient agreed with the plan and demonstrated an understanding of the instructions.   The patient was advised to call back or seek an in-person evaluation if the symptoms worsen or if the condition fails to improve as anticipated.  I provided 15 minutes of non-face-to-face time during this encounter.   Star Age, MD

## 2019-03-30 NOTE — Patient Instructions (Signed)
Given verbally, during today's virtual video-based encounter, with verbal feedback received.   

## 2019-03-30 NOTE — Progress Notes (Signed)
Order for cpap supplies sent to [facility] via community message. Confirmation received that the order transmitted was successful.  

## 2019-04-01 ENCOUNTER — Telehealth: Payer: Self-pay

## 2019-04-01 NOTE — Telephone Encounter (Signed)
I called pt to schedule his yearly follow up. No answer, left a message asking him to call me back. If pt calls back, please schedule his yearly follow up with the NP.

## 2019-05-01 DIAGNOSIS — Z125 Encounter for screening for malignant neoplasm of prostate: Secondary | ICD-10-CM | POA: Diagnosis not present

## 2019-05-01 DIAGNOSIS — Z0000 Encounter for general adult medical examination without abnormal findings: Secondary | ICD-10-CM | POA: Diagnosis not present

## 2019-05-01 DIAGNOSIS — R7301 Impaired fasting glucose: Secondary | ICD-10-CM | POA: Diagnosis not present

## 2019-05-01 DIAGNOSIS — I10 Essential (primary) hypertension: Secondary | ICD-10-CM | POA: Diagnosis not present

## 2019-05-01 DIAGNOSIS — R82998 Other abnormal findings in urine: Secondary | ICD-10-CM | POA: Diagnosis not present

## 2019-05-07 DIAGNOSIS — I10 Essential (primary) hypertension: Secondary | ICD-10-CM | POA: Diagnosis not present

## 2019-05-07 DIAGNOSIS — R7301 Impaired fasting glucose: Secondary | ICD-10-CM | POA: Diagnosis not present

## 2019-05-07 DIAGNOSIS — Z1331 Encounter for screening for depression: Secondary | ICD-10-CM | POA: Diagnosis not present

## 2019-05-07 DIAGNOSIS — G4733 Obstructive sleep apnea (adult) (pediatric): Secondary | ICD-10-CM | POA: Diagnosis not present

## 2019-05-07 DIAGNOSIS — K429 Umbilical hernia without obstruction or gangrene: Secondary | ICD-10-CM | POA: Diagnosis not present

## 2019-05-07 DIAGNOSIS — E785 Hyperlipidemia, unspecified: Secondary | ICD-10-CM | POA: Diagnosis not present

## 2019-05-07 DIAGNOSIS — F5221 Male erectile disorder: Secondary | ICD-10-CM | POA: Diagnosis not present

## 2019-05-07 DIAGNOSIS — Z0000 Encounter for general adult medical examination without abnormal findings: Secondary | ICD-10-CM | POA: Diagnosis not present

## 2019-05-07 DIAGNOSIS — J309 Allergic rhinitis, unspecified: Secondary | ICD-10-CM | POA: Diagnosis not present

## 2019-05-08 MED FILL — HYDROCHLOROTHIAZIDE 25 MG T: 25 | 90 days supply | Qty: 90 | Fill #0

## 2019-05-08 MED FILL — AMLODIPINE BESYLATE 10 MG T: 10 | 90 days supply | Qty: 90 | Fill #3

## 2019-05-12 DIAGNOSIS — G4733 Obstructive sleep apnea (adult) (pediatric): Secondary | ICD-10-CM | POA: Diagnosis not present

## 2019-06-22 MED FILL — BREO ELLIPTA 100-25 MCG INH: 100-25 | 30 days supply | Qty: 60 | Fill #2

## 2019-07-09 DIAGNOSIS — Z23 Encounter for immunization: Secondary | ICD-10-CM | POA: Diagnosis not present

## 2019-08-05 MED FILL — HYDROCHLOROTHIAZIDE 25 MG T: 25 | 90 days supply | Qty: 90 | Fill #1

## 2019-08-05 MED FILL — AMLODIPINE BESYLATE 10 MG T: 10 | 90 days supply | Qty: 90 | Fill #0

## 2019-08-13 DIAGNOSIS — G4733 Obstructive sleep apnea (adult) (pediatric): Secondary | ICD-10-CM | POA: Diagnosis not present

## 2019-09-17 DIAGNOSIS — E785 Hyperlipidemia, unspecified: Secondary | ICD-10-CM | POA: Diagnosis not present

## 2019-09-17 DIAGNOSIS — M545 Low back pain: Secondary | ICD-10-CM | POA: Diagnosis not present

## 2019-09-17 DIAGNOSIS — R7301 Impaired fasting glucose: Secondary | ICD-10-CM | POA: Diagnosis not present

## 2019-09-17 DIAGNOSIS — I160 Hypertensive urgency: Secondary | ICD-10-CM | POA: Diagnosis not present

## 2019-09-17 DIAGNOSIS — I10 Essential (primary) hypertension: Secondary | ICD-10-CM | POA: Diagnosis not present

## 2019-09-17 MED FILL — METOPROLOL SUCCINATE ER 50: 50 | 30 days supply | Qty: 30 | Fill #0

## 2019-09-17 MED FILL — traMADol HCL 50 MG TABS: 50 | 7 days supply | Qty: 21 | Fill #0

## 2019-09-17 MED FILL — CYCLOBENZAPRINE HCL 10 MG T: 10 | 7 days supply | Qty: 30 | Fill #0

## 2019-09-24 DIAGNOSIS — I160 Hypertensive urgency: Secondary | ICD-10-CM | POA: Diagnosis not present

## 2019-09-24 DIAGNOSIS — E785 Hyperlipidemia, unspecified: Secondary | ICD-10-CM | POA: Diagnosis not present

## 2019-09-24 DIAGNOSIS — I10 Essential (primary) hypertension: Secondary | ICD-10-CM | POA: Diagnosis not present

## 2019-09-24 DIAGNOSIS — R7301 Impaired fasting glucose: Secondary | ICD-10-CM | POA: Diagnosis not present

## 2019-09-28 DIAGNOSIS — M5442 Lumbago with sciatica, left side: Secondary | ICD-10-CM | POA: Diagnosis not present

## 2019-09-28 DIAGNOSIS — M9903 Segmental and somatic dysfunction of lumbar region: Secondary | ICD-10-CM | POA: Diagnosis not present

## 2019-09-29 DIAGNOSIS — M9903 Segmental and somatic dysfunction of lumbar region: Secondary | ICD-10-CM | POA: Diagnosis not present

## 2019-09-29 DIAGNOSIS — M5442 Lumbago with sciatica, left side: Secondary | ICD-10-CM | POA: Diagnosis not present

## 2019-09-30 DIAGNOSIS — M5442 Lumbago with sciatica, left side: Secondary | ICD-10-CM | POA: Diagnosis not present

## 2019-09-30 DIAGNOSIS — M9903 Segmental and somatic dysfunction of lumbar region: Secondary | ICD-10-CM | POA: Diagnosis not present

## 2019-10-06 DIAGNOSIS — M5442 Lumbago with sciatica, left side: Secondary | ICD-10-CM | POA: Diagnosis not present

## 2019-10-06 DIAGNOSIS — M9903 Segmental and somatic dysfunction of lumbar region: Secondary | ICD-10-CM | POA: Diagnosis not present

## 2019-10-27 DIAGNOSIS — M5442 Lumbago with sciatica, left side: Secondary | ICD-10-CM | POA: Diagnosis not present

## 2019-10-27 DIAGNOSIS — M9903 Segmental and somatic dysfunction of lumbar region: Secondary | ICD-10-CM | POA: Diagnosis not present

## 2019-10-28 DIAGNOSIS — M9903 Segmental and somatic dysfunction of lumbar region: Secondary | ICD-10-CM | POA: Diagnosis not present

## 2019-10-28 DIAGNOSIS — M5442 Lumbago with sciatica, left side: Secondary | ICD-10-CM | POA: Diagnosis not present

## 2019-10-30 MED FILL — AMLODIPINE BESYLATE 10 MG T: 10 | 90 days supply | Qty: 90 | Fill #1

## 2019-10-30 MED FILL — HYDROCHLOROTHIAZIDE 25 MG T: 25 | 90 days supply | Qty: 90 | Fill #2

## 2019-10-30 MED FILL — BREO ELLIPTA 100-25 MCG INH: 100-25 | 30 days supply | Qty: 60 | Fill #3

## 2019-11-17 MED FILL — METOPROLOL SUCCINATE ER 50: 50 | 30 days supply | Qty: 30 | Fill #1

## 2019-12-17 DIAGNOSIS — I10 Essential (primary) hypertension: Secondary | ICD-10-CM | POA: Diagnosis not present

## 2019-12-17 DIAGNOSIS — G4733 Obstructive sleep apnea (adult) (pediatric): Secondary | ICD-10-CM | POA: Diagnosis not present

## 2019-12-17 DIAGNOSIS — R7301 Impaired fasting glucose: Secondary | ICD-10-CM | POA: Diagnosis not present

## 2019-12-17 DIAGNOSIS — E785 Hyperlipidemia, unspecified: Secondary | ICD-10-CM | POA: Diagnosis not present

## 2019-12-28 ENCOUNTER — Telehealth: Payer: Self-pay | Admitting: Neurology

## 2019-12-28 NOTE — Telephone Encounter (Addendum)
Pt stated, "he was told sometime back he would be eligible for a new cpap machine after 5 yrs." Pt would like to know if he can get a new machine. Pt's last sleep study was 09/2014. Pt last seen Dr. Frances Furbish for his yr f/u on 03/30/2019. Please advise.

## 2019-12-29 NOTE — Telephone Encounter (Signed)
I have reached out to [facility] to see if he would be eligible for a new machine.

## 2019-12-30 MED FILL — BREO ELLIPTA 100-25 MCG INH: 100-25 | 30 days supply | Qty: 60 | Fill #4

## 2020-01-04 NOTE — Telephone Encounter (Signed)
[facility] reached back out to me and sts the pt would be eligable for a new machine but updated office notes would be required. Pt was advised to call back and schedule f/u appt.

## 2020-01-11 MED FILL — SYMBICORT 160-4.5 MCG INH: 160-4.5 | 30 days supply | Qty: 10 | Fill #0

## 2020-01-12 ENCOUNTER — Other Ambulatory Visit: Payer: Self-pay

## 2020-01-12 ENCOUNTER — Encounter: Payer: Self-pay | Admitting: Neurology

## 2020-01-12 ENCOUNTER — Ambulatory Visit: Payer: 59 | Admitting: Neurology

## 2020-01-12 VITALS — BP 144/92 | HR 65 | Temp 97.5°F | Ht 72.0 in | Wt 318.0 lb

## 2020-01-12 DIAGNOSIS — Z9989 Dependence on other enabling machines and devices: Secondary | ICD-10-CM

## 2020-01-12 DIAGNOSIS — G4733 Obstructive sleep apnea (adult) (pediatric): Secondary | ICD-10-CM | POA: Diagnosis not present

## 2020-01-12 NOTE — Patient Instructions (Signed)
As discussed, I will write for a new CPAP machine.  Please follow-up in about 3 months to see the nurse practitioner to make sure your compliance data looks fine.  After that, we can hopefully see you once yearly as before. Please call your DME company regarding your compliance download on the old machine.  I will make an addendum to today's note.

## 2020-01-12 NOTE — Progress Notes (Signed)
Subjective:    Patient ID: Jake Martin is a 59 y.o. male.  HPI     Interim history:  Jake Martin is a 59 year old right-handed gentleman with an underlying medical history of morbid obesity, umbilical hernia, hypertension, carpal tunnel syndrome, hyperlipidemia and pneumonia in 2013, who presents for follow-up consultation of his obstructive sleep apnea, established on CPAP therapy.  The patient is unaccompanied today.  I last saw him on 03/30/2019 in a virtual visit, at which time he was compliant with his CPAP and doing well.   Today, 01/12/20: I was not able to review his latest compliance data. We could not access his data remotely. He did not bring his machine or SD card from his machine.  He reports poor compliance with his machine, he uses a full facemask.  He needs new supplies but would like to wait until he gets his new machine, he should be eligible for new equipment at this point.  He is still debating whether to take the Covid vaccine.  His wife got vaccinated and his daughter got vaccinated as well.  Both work in the healthcare environment.  He got started on metoprolol for additional treatment of his blood pressure.      The patient's allergies, current medications, family history, past medical history, past social history, past surgical history and problem list were reviewed and updated as appropriate.    Previously (copied from previous notes for reference):     I saw him on 03/26/2018, at which time he was compliant with his CPAP and doing well.  He was advised to routinely follow-up in 1 year.      I reviewed his CPAP compliance data from 02/24/2019 through 03/25/2019 which is a total of 30 days, during which time he used his machine every night with percent used days greater than 4 hours at 97%, indicating excellent compliance, average usage of 6 hours and 36 minutes, residual AHI at goal at 2/h, leak acceptable with a 95th percentile at 7.2 L/min on a pressure of 10 cm.        I saw him on 09/01/2015, at which time he was compliant with CPAP therapy and reported doing well. He was advised to follow-up routinely in one year.   I reviewed his CPAP compliance data from 02/23/2018 through 03/24/2018, which is a total of 30 days, during which time he used his CPAP every night with percent used days greater than 4 hours at 90%, indicating excellent compliance with an average usage of 5 hours and 38 minutes, residual AHI at goal at 2.4 per hour, leak acceptable with the 95th percentile at 15.6 L/m on a pressure of 10 cm.    I saw him on 03/03/2015, at which time he reported feeling much better. He felt that his sleep quality was much better and his nocturia had improved. He woke up better rested with more daytime energy. He was working on weight loss. He was drinking more water.   I reviewed his CPAP compliance data from 08/01/2015 through 08/30/2015 which is a total of 30 days during which time his machine every night with percent used days greater than 4 hours at 90%, indicating excellent compliance with an average usage of 5 hours and 58 minutes, residual AHI reasonable at 3.7 per hour, leak at times high with the 95th percentile at 47.7 L/m on a pressure of 10 cm.      I first met him on 09/09/2014 at the request of his primary care  physician, at which time the patient reported snoring, daytime somnolence, and witnessed apneas. He also endorsed recent weight gain in the context of recent increase in stress. He had nocturia 1 usually. I invited him back for sleep study. He had a split-night sleep study on 10/07/2014 and went over his test results with him in detail today. His baseline sleep efficiency was 38.1% with a latency to sleep of 27.5 minutes and wake after sleep onset of 44 minutes with severe sleep fragmentation noted. He had a markedly elevated arousal index. He had an increased percentage of light stage sleep and absence of slow-wave and REM sleep. He had mild to  moderate snoring. Average oxygen saturation was 90%, nadir was 83%. He had a total AHI of 81.8 per hour. He was then titrated on CPAP. Sleep efficiency was 63.5%. His arousal index improved. He had an increased percentage of REM sleep during the treatment portion of the study. Average oxygen saturation was 93%, nadir was 82%. He was titrated from 5-11 cm with a reduction of his AHI to 1.7 per hour at the final pressure. Supine REM sleep was not achieved. The patient indicated that he did not sleep on his back. There were no significant periodic leg movements before or after his CPAP. Based on the test results are prescribed CPAP therapy for him. I prescribed a pressure of 11 cm, but he appears to have been on 8 cm all along.     I reviewed the patient's CPAP compliance data from 01/01/2015 through 01/30/2015 which is a total of 30 days during which time he used his machine every night with percent used days greater than 4 hours at 87%, indicating very good compliance with an average usage for all days of 5 hours and 25 minutes on a pressure of 8 cm with EPR of 1. Residual AHI acceptable at 4.4 per hour but leak at times high with the 95th percentile at 41.8 L/m.    In the interim, we contacted him with his compliance data results and requested that he meet with his DME company for adjusting of his mask. I also prescribed a nose mask with chinstrap for him and we faxed the order to his DME company.   I reviewed his CPAP compliance data from 01/31/2015 through 03/01/2015 which is a total of 30 days during which time he used his machine 29 days with percent used days greater than 4 hours of 73%, indicating adequate compliance with an average usage of 5 hours and 1 minute, residual AHI slightly suboptimal at 6.4 per hour and leak acceptable with the 95th percentile at 19.5 L/m on a pressure of 8 cm.   He reports snoring and witnessed apneas per wife as well as daytime somnolence. His ESS is 10-12. He endorses  stress and recent weight gain. He does not nap. He denies morning headaches or RLS symptoms. Snoring is loud. He has to go to the bathroom once per night, usually when his dog wakes him up in the early morning hours. He is not known to kick in his sleep. He denies nocturnal acid reflux symptoms. He does have nasal congestion which seems to be seasonal and occasionally has postnasal drip.   He goes to bed around midnight and typically falls asleep okay. He wakes up once per night on average and goes to the bathroom once per night on average. He estimates that he gets about 5-6 hours of sleep per night. He does wake up rested but has daytime  tiredness as the day progresses. He is a Psychiatrist. Rarely will he turned on his sides. He does not sleep on his back. He drinks a lot of caffeine, approximately 10 caffeine containing beverages per day including 6 cups of coffee and unsweet tea. He does not drink very much water at all. He may have a family history of obstructive sleep apnea in his mother.   He denies cataplexy, sleep paralysis, hypnagogic or hypnopompic hallucinations, or sleep attacks. He does not report any vivid dreams, nightmares, dream enactments, or parasomnias, such as sleep talking or sleep walking. The patient has not had a sleep study or a home sleep test.   He does not smoke. He drinks alcohol approximately 3-5 glasses of wine per week, sometimes in the form of beer in the summer time.   His Past Medical History Is Significant For: Past Medical History:  Diagnosis Date  . Carpal tunnel syndrome, right   . Hernia    umbilical hernia  . Hyperlipidemia    no meds /diet   . Hypertension   . Obesity   . OSA on CPAP   . Pneumonia     His Past Surgical History Is Significant For: Past Surgical History:  Procedure Laterality Date  . APPENDECTOMY  2005  . EYE SURGERY  1967-1968   [place]  . HERNIA REPAIR  2004  . WISDOM TOOTH EXTRACTION      His Family History Is Significant  For: Family History  Problem Relation Age of Onset  . Hypertension Sister   . COPD Father   . Hypertension Father   . Aneurysm Mother        BRAIN  . Diabetes Other   . Thyroid disease Other   . Diabetes type II Other        GRANDFATHER  . Coronary artery disease Other        GRANDMOTHER  . Diabetes Paternal Grandmother   . Diabetes Son     His Social History Is Significant For: Social History   Socioeconomic History  . Marital status: Married    Spouse name: Otila Kluver  . Number of children: 2  . Years of education: 50  . Highest education level: Not on file  Occupational History    Comment: [employer]  Tobacco Use  . Smoking status: Never Smoker  . Smokeless tobacco: Current User    Types: Chew  . Tobacco comment: Chews daily  Substance and Sexual Activity  . Alcohol use: Yes    Alcohol/week: 2.0 standard drinks    Types: 2 Standard drinks or equivalent per week    Comment: wine 3-5 glasses weekly  . Drug use: No  . Sexual activity: Not on file  Other Topics Concern  . Not on file  Social History Narrative   Right handed and consumes 3-5 cups of caffeine daily   Social Determinants of Health   Financial Resource Strain:   . Difficulty of Paying Living Expenses: Not on file  Food Insecurity:   . Worried About Charity fundraiser in the Last Year: Not on file  . Ran Out of Food in the Last Year: Not on file  Transportation Needs:   . Lack of Transportation (Medical): Not on file  . Lack of Transportation (Non-Medical): Not on file  Physical Activity:   . Days of Exercise per Week: Not on file  . Minutes of Exercise per Session: Not on file  Stress:   . Feeling of Stress :  Not on file  Social Connections:   . Frequency of Communication with Friends and Family: Not on file  . Frequency of Social Gatherings with Friends and Family: Not on file  . Attends Religious Services: Not on file  . Active Member of Clubs or Organizations: Not on file  .  Attends Archivist Meetings: Not on file  . Marital Status: Not on file    His Allergies Are:  Allergies  Allergen Reactions  . Lisinopril Other (See Comments)  :   His Current Medications Are:  Outpatient Encounter Medications as of 01/12/2020  Medication Sig  . amLODipine (NORVASC) 2.5 MG tablet Take 2.5 mg by mouth daily.  . Aspirin-Acetaminophen (GOODY BODY PAIN) 500-325 MG PACK Take 1 packet by mouth every 6 (six) hours as needed. Reported on 04/17/2016  . hydrochlorothiazide (HYDRODIURIL) 25 MG tablet Take 25 mg by mouth daily.  Marland Kitchen ibuprofen (ADVIL,MOTRIN) 200 MG tablet Take 400 mg by mouth every 6 (six) hours as needed. For pain  . metoprolol succinate (TOPROL-XL) 50 MG 24 hr tablet Take 50 mg by mouth daily.  . Multiple Vitamins-Minerals (MULTIVITAMIN WITH MINERALS) tablet Take 1 tablet by mouth daily.   No facility-administered encounter medications on file as of 01/12/2020.  :  Review of Systems:  Out of a complete 14 point review of systems, all are reviewed and negative with the exception of these symptoms as listed below: Review of Systems  Neurological:       Pt would like to get a new cpap machine.     Objective:  Neurological Exam  Physical Exam Physical Examination:   Vitals:   01/12/20 0834  BP: (!) 144/92  Pulse: 65  Temp: (!) 97.5 F (36.4 C)   General Examination: The patient is a very pleasant 59 y.o. male in no acute distress. He appears well-developed and well-nourished and well groomed.   HEENT:Normocephalic, atraumatic, pupils are equal, round and reactive to light, extraocular tracking is good without limitation to gaze excursion or nystagmus noted. Normal smooth pursuit is noted. Hearing is grossly intact. Face is symmetric with normal facial animation and normal facial sensation. Speech is clear with no dysarthria noted. There is no hypophonia. There is no lip, neck/head, jaw or voice tremor. Neck is supple with full range of passive  and active motion. Oropharynx exam reveals: moderate mouth dryness, good dental hygiene and marked airway crowding. Tongue protrudes centrally and palate elevates symmetrically.   Chest:Clear to auscultation without wheezing, rhonchi or crackles noted.  Heart:S1+S2+0, regular and normal without murmurs, rubs or gallops noted.   Abdomen:Soft, non-tender.  Extremities:There is no obvious change.   Skin: Warm and dry without trophic changes noted.   Musculoskeletal: exam reveals no obvious joint deformities, tenderness or joint swelling.  Neurologically:  Mental status: The patient is awake, alert and oriented in all 4 spheres. His immediate and remote memory, attention, language skills and fund of knowledge are appropriate. There is no evidence of aphasia, agnosia, apraxia or anomia. Speech is clear with normal prosody and enunciation. Thought process is linear. Mood is normal and affect is normal.  Cranial nerves II - XII are as described above under HEENT exam.  Motor exam: Normal bulk, strength and tone is noted. There is no tremor. Fine motor skills and coordination: intact grossly. There is no truncal or gait ataxia.  Sensory exam: intact to light touch.  Gait, station and balance: He stands easily. No veering to one side is noted. No leaning to one  side is noted. Posture is age-appropriate and stance is narrow based. Gait shows normal stride length and normal pace. No problems turning are noted.   Assessment and Plan:  In summary, Jake Martin is a very pleasant 59 year old male with an underlying medical history of morbid obesity, umbilical hernia, hypertension, carpal tunnel syndrome, hyperlipidemia and pneumonia in 2013,who presents for follow-up consultation of his severe obstructive sleep apnea diagnosed via split-night sleep study in November 2015, well established on CPAP therapy at a pressure of 10 cm with good results and ongoing good compliance. I  do not have access to his most recent compliance data, he is advised to talk to his DME company about getting a recent download and they can send it to [place] or attach as remotely, he can also bring his machine and I can make an addendum to this note.  He is eligible for a new machine, I have prescribed a new set of equipment, we will keep the pressure at 10 cm.  He has been using a full facemask and does well with that.  He is advised to follow-up routinely to see one of our nurse practitioners in 3 months so we can document his compliance at the time on the new equipment.  After that, hopefully, we can see him back yearly.  I answered all his questions today and he was in agreement. I spent 20 minutes in total face-to-face time and in reviewing records during pre-charting, more than 50% of which was spent in counseling and coordination of care, reviewing test results, reviewing medications and treatment regimen and/or in discussing or reviewing the diagnosis of OSA, the prognosis and treatment options. Pertinent laboratory and imaging test results that were available during this visit with the patient were reviewed by me and considered in my medical decision making (see chart for details).

## 2020-01-13 MED FILL — METOPROLOL SUCCINATE ER 50: 50 | 30 days supply | Qty: 30 | Fill #2

## 2020-01-27 DIAGNOSIS — G4733 Obstructive sleep apnea (adult) (pediatric): Secondary | ICD-10-CM | POA: Diagnosis not present

## 2020-02-03 MED FILL — AMLODIPINE BESYLATE 10 MG T: 10 | 90 days supply | Qty: 90 | Fill #0

## 2020-02-03 MED FILL — HYDROCHLOROTHIAZIDE 25 MG T: 25 | 90 days supply | Qty: 90 | Fill #0

## 2020-02-27 DIAGNOSIS — G4733 Obstructive sleep apnea (adult) (pediatric): Secondary | ICD-10-CM | POA: Diagnosis not present

## 2020-03-28 DIAGNOSIS — G4733 Obstructive sleep apnea (adult) (pediatric): Secondary | ICD-10-CM | POA: Diagnosis not present

## 2020-04-07 ENCOUNTER — Telehealth: Payer: Self-pay

## 2020-04-07 NOTE — Telephone Encounter (Signed)
Attempted to call pt about cpap data and cpap appt.  Unable to leave VM on all 3 numbers

## 2020-04-11 ENCOUNTER — Encounter: Payer: Self-pay | Admitting: Adult Health

## 2020-04-11 ENCOUNTER — Other Ambulatory Visit: Payer: Self-pay

## 2020-04-11 ENCOUNTER — Ambulatory Visit: Payer: 59 | Admitting: Adult Health

## 2020-04-11 VITALS — BP 138/88 | HR 64 | Ht 72.0 in | Wt 320.0 lb

## 2020-04-11 DIAGNOSIS — G4733 Obstructive sleep apnea (adult) (pediatric): Secondary | ICD-10-CM

## 2020-04-11 DIAGNOSIS — Z9989 Dependence on other enabling machines and devices: Secondary | ICD-10-CM | POA: Diagnosis not present

## 2020-04-11 NOTE — Progress Notes (Addendum)
PATIENT: Jake Martin DOB: 11-22-1960  REASON FOR VISIT: follow up HISTORY FROM: patient  HISTORY OF PRESENT ILLNESS: Today 04/11/20:  Mr. Recupero is a 59 year old male with a history of obstructive sleep apnea on CPAP.  His download indicates that he use his machine nightly for compliance of 100%.  He uses machine greater than 4 hours each night.  On average he uses his machine 6 hours and 33 minutes.  His residual AHI is 1.8 on 10 cm of water with an EPR of 1.  Leak in the 95th percentile is 6.4 L/min.  Reports that the CPAP is working well for him.  He returns today for follow-up  HISTORY 01/12/20: I was not able to review his latest compliance data. We could not access his data remotely. He did not bring his machine or SD card from his machine.  He reports poor compliance with his machine, he uses a full facemask.  He needs new supplies but would like to wait until he gets his new machine, he should be eligible for new equipment at this point.  He is still debating whether to take the Covid vaccine.  His wife got vaccinated and his daughter got vaccinated as well.  Both work in the healthcare environment.  He got started on metoprolol for additional treatment of his blood pressure.  REVIEW OF SYSTEMS: Out of a complete 14 system review of symptoms, the patient complains only of the following symptoms, and all other reviewed systems are negative.  ESS 5  ALLERGIES: Allergies  Allergen Reactions  . Lisinopril Other (See Comments)    HOME MEDICATIONS: Outpatient Medications Prior to Visit  Medication Sig Dispense Refill  . amLODipine (NORVASC) 2.5 MG tablet Take 2.5 mg by mouth daily.    . Aspirin-Acetaminophen (GOODY BODY PAIN) 500-325 MG PACK Take 1 packet by mouth every 6 (six) hours as needed. Reported on 04/17/2016    . hydrochlorothiazide (HYDRODIURIL) 25 MG tablet Take 25 mg by mouth daily.    Marland Kitchen ibuprofen (ADVIL,MOTRIN) 200 MG tablet Take 400 mg by mouth every 6  (six) hours as needed. For pain    . metoprolol succinate (TOPROL-XL) 50 MG 24 hr tablet Take 50 mg by mouth daily.    . Multiple Vitamins-Minerals (MULTIVITAMIN WITH MINERALS) tablet Take 1 tablet by mouth daily.     No facility-administered medications prior to visit.    PAST MEDICAL HISTORY: Past Medical History:  Diagnosis Date  . Carpal tunnel syndrome, right   . Hernia    umbilical hernia  . Hyperlipidemia    no meds /diet   . Hypertension   . Obesity   . OSA on CPAP   . Pneumonia     PAST SURGICAL HISTORY: Past Surgical History:  Procedure Laterality Date  . APPENDECTOMY  2005  . EYE SURGERY  1967-1968   Bil  . HERNIA REPAIR  2004  . WISDOM TOOTH EXTRACTION      FAMILY HISTORY: Family History  Problem Relation Age of Onset  . Hypertension Sister   . COPD Father   . Hypertension Father   . Aneurysm Mother        BRAIN  . Diabetes Other   . Thyroid disease Other   . Diabetes type II Other        GRANDFATHER  . Coronary artery disease Other        GRANDMOTHER  . Diabetes Paternal Grandmother   . Diabetes Son     SOCIAL HISTORY: Social  History   Socioeconomic History  . Marital status: Married    Spouse name: Otila Kluver  . Number of children: 2  . Years of education: 69  . Highest education level: Not on file  Occupational History    Comment: [employer]  Tobacco Use  . Smoking status: Never Smoker  . Smokeless tobacco: Current User    Types: Chew  . Tobacco comment: Chews daily  Substance and Sexual Activity  . Alcohol use: Yes    Alcohol/week: 2.0 standard drinks    Types: 2 Standard drinks or equivalent per week    Comment: wine 3-5 glasses weekly  . Drug use: No  . Sexual activity: Not on file  Other Topics Concern  . Not on file  Social History Narrative   Right handed and consumes 3-5 cups of caffeine daily   Social Determinants of Health   Financial Resource Strain:   . Difficulty of Paying Living Expenses:   Food  Insecurity:   . Worried About Charity fundraiser in the Last Year:   . Arboriculturist in the Last Year:   Transportation Needs:   . Film/video editor (Medical):   Marland Kitchen Lack of Transportation (Non-Medical):   Physical Activity:   . Days of Exercise per Week:   . Minutes of Exercise per Session:   Stress:   . Feeling of Stress :   Social Connections:   . Frequency of Communication with Friends and Family:   . Frequency of Social Gatherings with Friends and Family:   . Attends Religious Services:   . Active Member of Clubs or Organizations:   . Attends Archivist Meetings:   Marland Kitchen Marital Status:   Intimate Partner Violence:   . Fear of Current or Ex-Partner:   . Emotionally Abused:   Marland Kitchen Physically Abused:   . Sexually Abused:       PHYSICAL EXAM  Vitals:   04/11/20 0908  Pulse: 64  Weight: (!) 320 lb (145.2 kg)  Height: 6' (1.829 m)   Body mass index is 43.4 kg/m.  Generalized: Well developed, in no acute distress  Chest: Lungs clear to auscultation bilaterally  Neurological examination  Mentation: Alert oriented to time, place, history taking. Follows all commands speech and language fluent Cranial nerve II-XII: Extraocular movements were full, visual field were full on confrontational test Head turning and shoulder shrug  were normal and symmetric. Motor: The motor testing reveals 5 over 5 strength of all 4 extremities. Good symmetric motor tone is noted throughout.  Sensory: Sensory testing is intact to soft touch on all 4 extremities. No evidence of extinction is noted.  Gait and station: Gait is normal.    DIAGNOSTIC DATA (LABS, IMAGING, TESTING) - I reviewed patient records, labs, notes, testing and imaging myself where available.     ASSESSMENT AND PLAN 59 y.o. year old male  has a past medical history of Carpal tunnel syndrome, right, Hernia, Hyperlipidemia, Hypertension, Obesity, OSA on CPAP, and Pneumonia. here with:  1. OSA on CPAP  - CPAP  compliance excellent - Good treatment of AHI  - Encourage patient to use CPAP nightly and > 4 hours each night - F/U in 1 year or sooner if needed   I spent 20 minutes of face-to-face and non-face-to-face time with patient.  This included previsit chart review, lab review, study review, order entry, electronic health record documentation, patient education.  Ward Givens, MSN, NP-C 04/11/2020, 9:12 AM [facility] [phone], [address] [place], [place] [address] [phone]  I reviewed the above note and documentation by the Nurse Practitioner and agree with the history, exam, assessment and plan as outlined above. I was available for consultation. Huston Foley, MD, PhD [facility])

## 2020-04-11 NOTE — Patient Instructions (Signed)
Continue using CPAP nightly and greater than 4 hours each night °If your symptoms worsen or you develop new symptoms please let us know.  ° °

## 2020-04-19 MED FILL — AMLODIPINE BESYLATE 10 MG T: 10 | 90 days supply | Qty: 90 | Fill #1

## 2020-04-26 DIAGNOSIS — G4733 Obstructive sleep apnea (adult) (pediatric): Secondary | ICD-10-CM | POA: Diagnosis not present

## 2020-04-28 DIAGNOSIS — G4733 Obstructive sleep apnea (adult) (pediatric): Secondary | ICD-10-CM | POA: Diagnosis not present

## 2020-05-19 DIAGNOSIS — R7301 Impaired fasting glucose: Secondary | ICD-10-CM | POA: Diagnosis not present

## 2020-05-19 DIAGNOSIS — Z125 Encounter for screening for malignant neoplasm of prostate: Secondary | ICD-10-CM | POA: Diagnosis not present

## 2020-05-19 DIAGNOSIS — Z0000 Encounter for general adult medical examination without abnormal findings: Secondary | ICD-10-CM | POA: Diagnosis not present

## 2020-05-19 DIAGNOSIS — I10 Essential (primary) hypertension: Secondary | ICD-10-CM | POA: Diagnosis not present

## 2020-05-19 DIAGNOSIS — E7849 Other hyperlipidemia: Secondary | ICD-10-CM | POA: Diagnosis not present

## 2020-05-26 DIAGNOSIS — I10 Essential (primary) hypertension: Secondary | ICD-10-CM | POA: Diagnosis not present

## 2020-05-26 DIAGNOSIS — R7301 Impaired fasting glucose: Secondary | ICD-10-CM | POA: Diagnosis not present

## 2020-05-26 DIAGNOSIS — K429 Umbilical hernia without obstruction or gangrene: Secondary | ICD-10-CM | POA: Diagnosis not present

## 2020-05-26 DIAGNOSIS — E785 Hyperlipidemia, unspecified: Secondary | ICD-10-CM | POA: Diagnosis not present

## 2020-05-26 DIAGNOSIS — Z0000 Encounter for general adult medical examination without abnormal findings: Secondary | ICD-10-CM | POA: Diagnosis not present

## 2020-05-26 DIAGNOSIS — M545 Low back pain: Secondary | ICD-10-CM | POA: Diagnosis not present

## 2020-05-26 DIAGNOSIS — R82998 Other abnormal findings in urine: Secondary | ICD-10-CM | POA: Diagnosis not present

## 2020-05-26 DIAGNOSIS — G4733 Obstructive sleep apnea (adult) (pediatric): Secondary | ICD-10-CM | POA: Diagnosis not present

## 2020-05-26 DIAGNOSIS — Z1331 Encounter for screening for depression: Secondary | ICD-10-CM | POA: Diagnosis not present

## 2020-05-26 DIAGNOSIS — F5221 Male erectile disorder: Secondary | ICD-10-CM | POA: Diagnosis not present

## 2020-05-26 MED FILL — CYCLOBENZAPRINE HCL 10 MG T: 10 | 7 days supply | Qty: 30 | Fill #0

## 2020-06-15 MED FILL — SYMBICORT 160-4.5 MCG INH: 160-4.5 | 30 days supply | Qty: 10 | Fill #1

## 2020-07-27 DIAGNOSIS — G4733 Obstructive sleep apnea (adult) (pediatric): Secondary | ICD-10-CM | POA: Diagnosis not present

## 2020-07-28 MED FILL — HYDROCHLOROTHIAZIDE 25 MG T: 25 | 90 days supply | Qty: 90 | Fill #2

## 2020-07-28 MED FILL — AMLODIPINE BESYLATE 10 MG T: 10 | 90 days supply | Qty: 90 | Fill #2

## 2020-09-02 DIAGNOSIS — Z23 Encounter for immunization: Secondary | ICD-10-CM | POA: Diagnosis not present

## 2020-09-27 DIAGNOSIS — H5213 Myopia, bilateral: Secondary | ICD-10-CM | POA: Diagnosis not present

## 2020-09-27 DIAGNOSIS — H524 Presbyopia: Secondary | ICD-10-CM | POA: Diagnosis not present

## 2020-10-06 MED FILL — SYMBICORT 160-4.5 MCG INH: 160-4.5 | 30 days supply | Qty: 10 | Fill #2

## 2020-10-30 ENCOUNTER — Other Ambulatory Visit (HOSPITAL_COMMUNITY): Payer: Self-pay | Admitting: Internal Medicine

## 2020-10-31 MED FILL — HYDROCHLOROTHIAZIDE 25 MG T: 25 | 90 days supply | Qty: 90 | Fill #0

## 2020-10-31 MED FILL — AMLODIPINE BESYLATE 10 MG T: 10 | 90 days supply | Qty: 90 | Fill #0

## 2020-11-22 DIAGNOSIS — I10 Essential (primary) hypertension: Secondary | ICD-10-CM | POA: Diagnosis not present

## 2020-11-22 DIAGNOSIS — E785 Hyperlipidemia, unspecified: Secondary | ICD-10-CM | POA: Diagnosis not present

## 2020-11-22 DIAGNOSIS — R7301 Impaired fasting glucose: Secondary | ICD-10-CM | POA: Diagnosis not present

## 2020-11-22 DIAGNOSIS — K429 Umbilical hernia without obstruction or gangrene: Secondary | ICD-10-CM | POA: Diagnosis not present

## 2020-11-22 DIAGNOSIS — J00 Acute nasopharyngitis [common cold]: Secondary | ICD-10-CM | POA: Diagnosis not present

## 2020-11-22 DIAGNOSIS — G4733 Obstructive sleep apnea (adult) (pediatric): Secondary | ICD-10-CM | POA: Diagnosis not present

## 2020-11-22 DIAGNOSIS — I872 Venous insufficiency (chronic) (peripheral): Secondary | ICD-10-CM | POA: Diagnosis not present

## 2020-12-18 ENCOUNTER — Other Ambulatory Visit (HOSPITAL_COMMUNITY): Payer: Self-pay | Admitting: Internal Medicine

## 2020-12-19 MED FILL — SYMBICORT 160-4.5 MCG INH: 160-4.5 | 30 days supply | Qty: 10 | Fill #0

## 2020-12-29 DIAGNOSIS — G4733 Obstructive sleep apnea (adult) (pediatric): Secondary | ICD-10-CM | POA: Diagnosis not present

## 2021-01-07 ENCOUNTER — Other Ambulatory Visit: Payer: Self-pay

## 2021-01-07 ENCOUNTER — Encounter (HOSPITAL_COMMUNITY)
Admission: EM | Disposition: A | Discharge status: Home / Self Care | Payer: Self-pay | Source: Home / Self Care | Attending: Emergency Medicine

## 2021-01-07 ENCOUNTER — Emergency Department (HOSPITAL_COMMUNITY): Payer: 59 | Admitting: Anesthesiology

## 2021-01-07 ENCOUNTER — Encounter (HOSPITAL_COMMUNITY): Payer: Self-pay | Admitting: Emergency Medicine

## 2021-01-07 ENCOUNTER — Observation Stay (HOSPITAL_COMMUNITY)
Admission: EM | Admit: 2021-01-07 | Discharge: 2021-01-08 | Disposition: A | Discharge status: Home / Self Care | Payer: 59 | Attending: Surgery | Admitting: Surgery

## 2021-01-07 ENCOUNTER — Emergency Department (HOSPITAL_COMMUNITY): Payer: 59

## 2021-01-07 DIAGNOSIS — Z20822 Contact with and (suspected) exposure to covid-19: Secondary | ICD-10-CM | POA: Diagnosis not present

## 2021-01-07 DIAGNOSIS — K469 Unspecified abdominal hernia without obstruction or gangrene: Secondary | ICD-10-CM

## 2021-01-07 DIAGNOSIS — I10 Essential (primary) hypertension: Secondary | ICD-10-CM | POA: Diagnosis not present

## 2021-01-07 DIAGNOSIS — M4319 Spondylolisthesis, multiple sites in spine: Secondary | ICD-10-CM | POA: Diagnosis not present

## 2021-01-07 DIAGNOSIS — F17220 Nicotine dependence, chewing tobacco, uncomplicated: Secondary | ICD-10-CM | POA: Insufficient documentation

## 2021-01-07 DIAGNOSIS — G4733 Obstructive sleep apnea (adult) (pediatric): Secondary | ICD-10-CM | POA: Diagnosis not present

## 2021-01-07 DIAGNOSIS — Z9889 Other specified postprocedural states: Secondary | ICD-10-CM

## 2021-01-07 DIAGNOSIS — Z8719 Personal history of other diseases of the digestive system: Secondary | ICD-10-CM

## 2021-01-07 DIAGNOSIS — K7689 Other specified diseases of liver: Secondary | ICD-10-CM | POA: Diagnosis not present

## 2021-01-07 DIAGNOSIS — K420 Umbilical hernia with obstruction, without gangrene: Secondary | ICD-10-CM | POA: Diagnosis not present

## 2021-01-07 DIAGNOSIS — Z9989 Dependence on other enabling machines and devices: Secondary | ICD-10-CM | POA: Diagnosis not present

## 2021-01-07 DIAGNOSIS — I708 Atherosclerosis of other arteries: Secondary | ICD-10-CM | POA: Diagnosis not present

## 2021-01-07 DIAGNOSIS — K439 Ventral hernia without obstruction or gangrene: Secondary | ICD-10-CM | POA: Diagnosis not present

## 2021-01-07 HISTORY — PX: LAPAROSCOPIC ASSISTED VENTRAL HERNIA REPAIR: SHX6312

## 2021-01-07 LAB — CBC WITH DIFFERENTIAL/PLATELET
Abs Immature Granulocytes: 0.01 10*3/uL (ref 0.00–0.07)
Basophils Absolute: 0.1 10*3/uL (ref 0.0–0.1)
Basophils Relative: 1 %
Eosinophils Absolute: 0.3 10*3/uL (ref 0.0–0.5)
Eosinophils Relative: 5 %
HCT: 50.8 % (ref 39.0–52.0)
Hemoglobin: 16.7 g/dL (ref 13.0–17.0)
Immature Granulocytes: 0 %
Lymphocytes Relative: 26 %
Lymphs Abs: 1.4 10*3/uL (ref 0.7–4.0)
MCH: 28.7 pg (ref 26.0–34.0)
MCHC: 32.9 g/dL (ref 30.0–36.0)
MCV: 87.4 fL (ref 80.0–100.0)
Monocytes Absolute: 0.4 10*3/uL (ref 0.1–1.0)
Monocytes Relative: 8 %
Neutro Abs: 3.2 10*3/uL (ref 1.7–7.7)
Neutrophils Relative %: 60 %
Platelets: 187 10*3/uL (ref 150–400)
RBC: 5.81 MIL/uL (ref 4.22–5.81)
RDW: 14.4 % (ref 11.5–15.5)
WBC: 5.3 10*3/uL (ref 4.0–10.5)
nRBC: 0 % (ref 0.0–0.2)

## 2021-01-07 LAB — COMPREHENSIVE METABOLIC PANEL
ALT: 20 U/L (ref 0–44)
AST: 23 U/L (ref 15–41)
Albumin: 4.3 g/dL (ref 3.5–5.0)
Alkaline Phosphatase: 50 U/L (ref 38–126)
Anion gap: 9 (ref 5–15)
BUN: 15 mg/dL (ref 6–20)
CO2: 28 mmol/L (ref 22–32)
Calcium: 9.9 mg/dL (ref 8.9–10.3)
Chloride: 102 mmol/L (ref 98–111)
Creatinine, Ser: 0.76 mg/dL (ref 0.61–1.24)
GFR, Estimated: >60 mL/min (ref >60)
Glucose, Bld: 109 mg/dL — ABNORMAL HIGH (ref 70–99)
Potassium: 4.1 mmol/L (ref 3.5–5.1)
Sodium: 139 mmol/L (ref 135–145)
Total Bilirubin: 1.2 mg/dL (ref 0.3–1.2)
Total Protein: 8 g/dL (ref 6.5–8.1)

## 2021-01-07 LAB — RESP PANEL BY RT-PCR (FLU A&B, COVID) ARPGX2
Influenza A by PCR: NEGATIVE
Influenza B by PCR: NEGATIVE
SARS Coronavirus 2 by RT PCR: NEGATIVE

## 2021-01-07 LAB — LIPASE, BLOOD: Lipase: 34 U/L (ref 11–51)

## 2021-01-07 SURGERY — REPAIR, HERNIA, VENTRAL, LAPAROSCOPY-ASSISTED
Anesthesia: General | Site: Abdomen | Laterality: Bilateral

## 2021-01-07 MED ORDER — METHOCARBAMOL 500 MG PO TABS: 500.0000 mg | ORAL_TABLET | Freq: Four times a day (QID) | ORAL | Status: DC | PRN

## 2021-01-07 MED ORDER — POTASSIUM CHLORIDE IN NACL 20-0.9 MEQ/L-% IV SOLN
INTRAVENOUS | Status: DC
  Filled 2021-01-07: qty 1000

## 2021-01-07 MED ORDER — MOMETASONE FURO-FORMOTEROL FUM 200-5 MCG/ACT IN AERO
2.0000 | INHALATION_SPRAY | Freq: Two times a day (BID) | RESPIRATORY_TRACT | Status: DC
  Filled 2021-01-07: qty 8.8

## 2021-01-07 MED ORDER — BUPIVACAINE-EPINEPHRINE 0.25% -1:200000 IJ SOLN
INTRAMUSCULAR | Status: AC
  Filled 2021-01-07: qty 1

## 2021-01-07 MED ORDER — AMLODIPINE BESYLATE 5 MG PO TABS
2.5000 mg | ORAL_TABLET | Freq: Every day | ORAL | Status: DC
  Administered 2021-01-08: 2.5 mg via ORAL
  Filled 2021-01-07: qty 1

## 2021-01-07 MED ORDER — ONDANSETRON 4 MG PO TBDP: 4.0000 mg | ORAL_TABLET | Freq: Four times a day (QID) | ORAL | Status: DC | PRN

## 2021-01-07 MED ORDER — DIPHENHYDRAMINE HCL 12.5 MG/5ML PO ELIX: 12.5000 mg | ORAL_SOLUTION | Freq: Four times a day (QID) | ORAL | Status: DC | PRN

## 2021-01-07 MED ORDER — ONDANSETRON HCL 4 MG/2ML IJ SOLN: 4.0000 mg | Freq: Four times a day (QID) | INTRAMUSCULAR | Status: DC | PRN

## 2021-01-07 MED ORDER — BUPIVACAINE LIPOSOME 1.3 % IJ SUSP
20.0000 mL | Freq: Once | INTRAMUSCULAR | Status: AC
  Administered 2021-01-07: 20 mL
  Filled 2021-01-07: qty 20

## 2021-01-07 MED ORDER — SIMETHICONE 80 MG PO CHEW: 40.0000 mg | CHEWABLE_TABLET | Freq: Four times a day (QID) | ORAL | Status: DC | PRN

## 2021-01-07 MED ORDER — VANCOMYCIN HCL 2000 MG/400ML IV SOLN
2000.0000 mg | INTRAVENOUS | Status: AC
  Administered 2021-01-07: 2000 mg via INTRAVENOUS
  Filled 2021-01-07 (×2): qty 400

## 2021-01-07 MED ORDER — LACTATED RINGERS IV SOLN: INTRAVENOUS | Status: DC

## 2021-01-07 MED ORDER — MIDAZOLAM HCL 5 MG/5ML IJ SOLN
INTRAMUSCULAR | Status: DC | PRN
  Administered 2021-01-07: 2 mg via INTRAVENOUS

## 2021-01-07 MED ORDER — ROCURONIUM BROMIDE 10 MG/ML (PF) SYRINGE
PREFILLED_SYRINGE | INTRAVENOUS | Status: DC | PRN
  Administered 2021-01-07: 20 mg via INTRAVENOUS
  Administered 2021-01-07: 80 mg via INTRAVENOUS

## 2021-01-07 MED ORDER — ALBUTEROL SULFATE HFA 108 (90 BASE) MCG/ACT IN AERS
INHALATION_SPRAY | RESPIRATORY_TRACT | Status: DC | PRN
  Administered 2021-01-07: 4 via RESPIRATORY_TRACT

## 2021-01-07 MED ORDER — ENOXAPARIN SODIUM 40 MG/0.4ML ~~LOC~~ SOLN
40.0000 mg | SUBCUTANEOUS | Status: DC
  Administered 2021-01-08: 09:00:00 40 mg via SUBCUTANEOUS
  Filled 2021-01-07: qty 0.4

## 2021-01-07 MED ORDER — SODIUM CHLORIDE 0.9 % IV SOLN: INTRAVENOUS | Status: DC | PRN

## 2021-01-07 MED ORDER — LACTATED RINGERS IV SOLN: INTRAVENOUS | Status: DC | PRN

## 2021-01-07 MED ORDER — DOCUSATE SODIUM 100 MG PO CAPS
100.0000 mg | ORAL_CAPSULE | Freq: Two times a day (BID) | ORAL | Status: DC
  Administered 2021-01-07 – 2021-01-08 (×2): 100 mg via ORAL
  Filled 2021-01-07 (×2): qty 1

## 2021-01-07 MED ORDER — SUGAMMADEX SODIUM 200 MG/2ML IV SOLN
INTRAVENOUS | Status: DC | PRN
  Administered 2021-01-07: 300 mg via INTRAVENOUS

## 2021-01-07 MED ORDER — ALBUTEROL SULFATE HFA 108 (90 BASE) MCG/ACT IN AERS
INHALATION_SPRAY | RESPIRATORY_TRACT | Status: AC
  Filled 2021-01-07: qty 6.7

## 2021-01-07 MED ORDER — KETOROLAC TROMETHAMINE 15 MG/ML IJ SOLN: 15.0000 mg | Freq: Four times a day (QID) | INTRAMUSCULAR | Status: DC | PRN

## 2021-01-07 MED ORDER — DEXAMETHASONE SODIUM PHOSPHATE 10 MG/ML IJ SOLN
INTRAMUSCULAR | Status: DC | PRN
  Administered 2021-01-07: 10 mg via INTRAVENOUS

## 2021-01-07 MED ORDER — ONDANSETRON HCL 4 MG/2ML IJ SOLN
INTRAMUSCULAR | Status: AC
  Filled 2021-01-07: qty 2

## 2021-01-07 MED ORDER — HYDROCHLOROTHIAZIDE 25 MG PO TABS
25.0000 mg | ORAL_TABLET | Freq: Every day | ORAL | Status: DC
Start: 2021-01-08 — End: 2021-01-08
  Administered 2021-01-08: 25 mg via ORAL
  Filled 2021-01-07: qty 1

## 2021-01-07 MED ORDER — ACETAMINOPHEN 500 MG PO TABS
1000.0000 mg | ORAL_TABLET | Freq: Four times a day (QID) | ORAL | Status: DC
  Administered 2021-01-07 – 2021-01-08 (×3): 1000 mg via ORAL
  Filled 2021-01-07 (×3): qty 2

## 2021-01-07 MED ORDER — PROPOFOL 10 MG/ML IV BOLUS
INTRAVENOUS | Status: AC
  Filled 2021-01-07: qty 20

## 2021-01-07 MED ORDER — FENTANYL CITRATE (PF) 250 MCG/5ML IJ SOLN
INTRAMUSCULAR | Status: AC
  Filled 2021-01-07: qty 5

## 2021-01-07 MED ORDER — ACETAMINOPHEN 325 MG PO TABS
325.0000 mg | ORAL_TABLET | Freq: Once | ORAL | Status: DC | PRN
Start: 2021-01-07 — End: 2021-01-07

## 2021-01-07 MED ORDER — EPHEDRINE SULFATE-NACL 50-0.9 MG/10ML-% IV SOSY
PREFILLED_SYRINGE | INTRAVENOUS | Status: DC | PRN
  Administered 2021-01-07: 5 mg via INTRAVENOUS
  Administered 2021-01-07: 10 mg via INTRAVENOUS

## 2021-01-07 MED ORDER — MEPERIDINE HCL 50 MG/ML IJ SOLN: 6.2500 mg | INTRAMUSCULAR | Status: DC | PRN

## 2021-01-07 MED ORDER — HYDROMORPHONE HCL 1 MG/ML IJ SOLN
0.2500 mg | INTRAMUSCULAR | Status: DC | PRN
  Administered 2021-01-07: 0.5 mg via INTRAVENOUS

## 2021-01-07 MED ORDER — MIDAZOLAM HCL 2 MG/2ML IJ SOLN
INTRAMUSCULAR | Status: AC
  Filled 2021-01-07: qty 2

## 2021-01-07 MED ORDER — TRAMADOL HCL 50 MG PO TABS: 50.0000 mg | ORAL_TABLET | Freq: Four times a day (QID) | ORAL | Status: DC | PRN

## 2021-01-07 MED ORDER — ROCURONIUM BROMIDE 10 MG/ML (PF) SYRINGE
PREFILLED_SYRINGE | INTRAVENOUS | Status: AC
  Filled 2021-01-07: qty 10

## 2021-01-07 MED ORDER — SODIUM CHLORIDE 0.9 % IV BOLUS
1000.0000 mL | Freq: Once | INTRAVENOUS | Status: AC
  Administered 2021-01-07: 1000 mL via INTRAVENOUS

## 2021-01-07 MED ORDER — FENTANYL CITRATE (PF) 250 MCG/5ML IJ SOLN
INTRAMUSCULAR | Status: DC | PRN
  Administered 2021-01-07: 100 ug via INTRAVENOUS
  Administered 2021-01-07 (×3): 50 ug via INTRAVENOUS

## 2021-01-07 MED ORDER — ACETAMINOPHEN 160 MG/5ML PO SOLN: 325.0000 mg | Freq: Once | ORAL | Status: DC | PRN

## 2021-01-07 MED ORDER — ACETAMINOPHEN 10 MG/ML IV SOLN: 1000.0000 mg | Freq: Once | INTRAVENOUS | Status: DC | PRN

## 2021-01-07 MED ORDER — EPHEDRINE 5 MG/ML INJ
INTRAVENOUS | Status: AC
  Filled 2021-01-07: qty 10

## 2021-01-07 MED ORDER — DIPHENHYDRAMINE HCL 50 MG/ML IJ SOLN: 12.5000 mg | Freq: Four times a day (QID) | INTRAMUSCULAR | Status: DC | PRN

## 2021-01-07 MED ORDER — PROPOFOL 10 MG/ML IV BOLUS
INTRAVENOUS | Status: DC | PRN
  Administered 2021-01-07: 150 mg via INTRAVENOUS

## 2021-01-07 MED ORDER — AMISULPRIDE (ANTIEMETIC) 5 MG/2ML IV SOLN: 10.0000 mg | Freq: Once | INTRAVENOUS | Status: DC | PRN

## 2021-01-07 MED ORDER — IOHEXOL 300 MG/ML  SOLN
100.0000 mL | Freq: Once | INTRAMUSCULAR | Status: AC | PRN
  Administered 2021-01-07: 100 mL via INTRAVENOUS

## 2021-01-07 MED ORDER — SUCCINYLCHOLINE CHLORIDE 200 MG/10ML IV SOSY
PREFILLED_SYRINGE | INTRAVENOUS | Status: DC | PRN
  Administered 2021-01-07: 140 mg via INTRAVENOUS

## 2021-01-07 MED ORDER — ONDANSETRON HCL 4 MG/2ML IJ SOLN
INTRAMUSCULAR | Status: DC | PRN
  Administered 2021-01-07: 4 mg via INTRAVENOUS

## 2021-01-07 MED ORDER — LIDOCAINE HCL (PF) 2 % IJ SOLN
INTRAMUSCULAR | Status: AC
  Filled 2021-01-07: qty 10

## 2021-01-07 MED ORDER — 0.9 % SODIUM CHLORIDE (POUR BTL) OPTIME
TOPICAL | Status: DC | PRN
Start: 2021-01-07 — End: 2021-01-07
  Administered 2021-01-07: 1000 mL

## 2021-01-07 MED ORDER — HYDRALAZINE HCL 20 MG/ML IJ SOLN: 10.0000 mg | INTRAMUSCULAR | Status: DC | PRN

## 2021-01-07 MED ORDER — BUPIVACAINE-EPINEPHRINE 0.25% -1:200000 IJ SOLN
INTRAMUSCULAR | Status: DC | PRN
  Administered 2021-01-07: 50 mL

## 2021-01-07 MED ORDER — HYDROMORPHONE HCL 1 MG/ML IJ SOLN
INTRAMUSCULAR | Status: AC
  Filled 2021-01-07: qty 1

## 2021-01-07 MED ORDER — MORPHINE SULFATE (PF) 2 MG/ML IV SOLN: 1.0000 mg | INTRAVENOUS | Status: DC | PRN

## 2021-01-07 MED ORDER — DEXAMETHASONE SODIUM PHOSPHATE 10 MG/ML IJ SOLN
INTRAMUSCULAR | Status: AC
  Filled 2021-01-07: qty 1

## 2021-01-07 SURGICAL SUPPLY — 46 items
ADH SKN CLS APL DERMABOND .7 (GAUZE/BANDAGES/DRESSINGS)
APL PRP STRL LF DISP 70% ISPRP (MISCELLANEOUS) ×1
APL SKNCLS STERI-STRIP NONHPOA (GAUZE/BANDAGES/DRESSINGS)
BENZOIN TINCTURE PRP APPL 2/3 (GAUZE/BANDAGES/DRESSINGS) IMPLANT
BINDER ABDOMINAL 12 ML 46-62 (SOFTGOODS) ×2 IMPLANT
CABLE HIGH FREQUENCY MONO STRZ (ELECTRODE) ×2 IMPLANT
CHLORAPREP W/TINT 26 (MISCELLANEOUS) ×2 IMPLANT
COVER SURGICAL LIGHT HANDLE (MISCELLANEOUS) ×2 IMPLANT
COVER WAND RF STERILE (DRAPES) IMPLANT
DECANTER SPIKE VIAL GLASS SM (MISCELLANEOUS) ×2 IMPLANT
DERMABOND ADVANCED (GAUZE/BANDAGES/DRESSINGS)
DERMABOND ADVANCED .7 DNX12 (GAUZE/BANDAGES/DRESSINGS) IMPLANT
DEVICE SECURE STRAP 25 ABSORB (INSTRUMENTS) IMPLANT
DEVICE TROCAR PUNCTURE CLOSURE (ENDOMECHANICALS) ×2 IMPLANT
DRAIN CHANNEL 19F RND (DRAIN) IMPLANT
DRAPE INCISE IOBAN 66X45 STRL (DRAPES) IMPLANT
DRSG TEGADERM 4X4.75 (GAUZE/BANDAGES/DRESSINGS) ×2 IMPLANT
ELECT PENCIL ROCKER SW 15FT (MISCELLANEOUS) IMPLANT
EVACUATOR SILICONE 100CC (DRAIN) IMPLANT
GAUZE SPONGE 2X2 8PLY STRL LF (GAUZE/BANDAGES/DRESSINGS) ×1 IMPLANT
GOWN STRL REUS W/TWL XL LVL3 (GOWN DISPOSABLE) ×6 IMPLANT
KIT BASIN OR (CUSTOM PROCEDURE TRAY) ×2 IMPLANT
L-HOOK LAP DISP 36CM (ELECTROSURGICAL)
LHOOK LAP DISP 36CM (ELECTROSURGICAL) IMPLANT
MARKER SKIN DUAL TIP RULER LAB (MISCELLANEOUS) ×2 IMPLANT
NEEDLE SPNL 22GX3.5 QUINCKE BK (NEEDLE) ×2 IMPLANT
PAD POSITIONING PINK XL (MISCELLANEOUS) IMPLANT
SCISSORS LAP 5X35 DISP (ENDOMECHANICALS) IMPLANT
SET IRRIG TUBING LAPAROSCOPIC (IRRIGATION / IRRIGATOR) IMPLANT
SET TUBE SMOKE EVAC HIGH FLOW (TUBING) ×2 IMPLANT
SHEARS HARMONIC ACE PLUS 36CM (ENDOMECHANICALS) IMPLANT
SLEEVE XCEL OPT CAN 5 100 (ENDOMECHANICALS) ×2 IMPLANT
SPONGE GAUZE 2X2 STER 10/PKG (GAUZE/BANDAGES/DRESSINGS) ×1
STRIP CLOSURE SKIN 1/2X4 (GAUZE/BANDAGES/DRESSINGS) IMPLANT
SUT ETHILON 2 0 PS N (SUTURE) IMPLANT
SUT MNCRL AB 4-0 PS2 18 (SUTURE) ×2 IMPLANT
SUT NOVA 1 T20/GS 25DT (SUTURE) IMPLANT
SUT SILK 2 0 (SUTURE) ×2
SUT SILK 2-0 18XBRD TIE 12 (SUTURE) ×1 IMPLANT
SUT VIC AB 3-0 SH 18 (SUTURE) IMPLANT
TOWEL OR 17X26 10 PK STRL BLUE (TOWEL DISPOSABLE) ×2 IMPLANT
TRAY FOLEY MTR SLVR 14FR STAT (SET/KITS/TRAYS/PACK) IMPLANT
TRAY FOLEY MTR SLVR 16FR STAT (SET/KITS/TRAYS/PACK) IMPLANT
TRAY LAPAROSCOPIC (CUSTOM PROCEDURE TRAY) ×2 IMPLANT
TROCAR BLADELESS OPT 5 100 (ENDOMECHANICALS) ×2 IMPLANT
TROCAR XCEL NON-BLD 11X100MML (ENDOMECHANICALS) IMPLANT

## 2021-01-07 NOTE — H&P (Signed)
CC: my hernia is stuck  Requesting provider: Dr Rush Landmark  HPI: Jake Martin is an 60 y.o. male who is here for evaluation because he was unable to reduce his supraumbilical hernia.  He states he had a previous open hernia repair in that location back in 2004.  He thinks that they use mesh.  He states that he noticed a recurrence just before the Covid pandemic happened.  He has typically been able to reduce it himself.  However last night it became unable to be reduced.  He came to the emergency room because he started having some redness and discomfort around the skin.  He is accompanied by his wife.  He denies any fever, chills, nausea or vomiting.  Last bowel movement was this morning.  No other abdominal surgery other than appendectomy.  He has lost 40 pounds over the past year on purpose.  He has obstructive sleep apnea and uses CPAP.  He also has asthma/reactive airway disease-sensitive to dust.  Denies chest pain, chest pressure, dyspnea on exertion, TIAs orthopnea, amaurosis fugax.  Denies personal family history of blood clots, prior MIs or strokes  Past Medical History:  Diagnosis Date  . Carpal tunnel syndrome, right   . Hernia    umbilical hernia  . Hyperlipidemia    no meds /diet   . Hypertension   . Obesity   . OSA on CPAP   . Pneumonia     Past Surgical History:  Procedure Laterality Date  . APPENDECTOMY  2005  . EYE SURGERY  1967-1968   [place]  . HERNIA REPAIR  2004  . WISDOM TOOTH EXTRACTION      Family History  Problem Relation Age of Onset  . Hypertension Sister   . COPD Father   . Hypertension Father   . Aneurysm Mother        BRAIN  . Diabetes Other   . Thyroid disease Other   . Diabetes type II Other        GRANDFATHER  . Coronary artery disease Other        GRANDMOTHER  . Diabetes Paternal Grandmother   . Diabetes Son     Social:  reports that he has never smoked. His smokeless tobacco use includes chew. He reports current alcohol use  of about 2.0 standard drinks of alcohol per week. He reports that he does not use drugs.  Allergies:  Allergies  Allergen Reactions  . Lisinopril Swelling    Lips and eyes swell    Medications: I have reviewed the patient's current medications.   ROS - all of the below systems have been reviewed with the patient and positives are indicated with bold text General: chills, fever or night sweats Eyes: blurry vision or double vision ENT: epistaxis or sore throat Allergy/Immunology: itchy/watery eyes or nasal congestion Hematologic/Lymphatic: bleeding problems, blood clots or swollen lymph nodes Endocrine: temperature intolerance or unexpected weight changes Breast: new or changing breast lumps or nipple discharge Resp: cough, shortness of breath, or wheezing CV: chest pain or dyspnea on exertion GI: as per HPI GU: dysuria, trouble voiding, or hematuria MSK: joint pain or joint stiffness Neuro: TIA or stroke symptoms Derm: pruritus and skin lesion changes Psych: anxiety and depression  PE Blood pressure 136/80, pulse (!) 51, temperature 97.9 F (36.6 C), temperature source Oral, resp. rate 16, height 5\' 11"  (1.803 m), weight 127 kg, SpO2 96 %. Constitutional: NAD; conversant; no deformities; obesity Eyes: Moist conjunctiva; no lid lag; anicteric; PERRL Neck:  Trachea midline; no thyromegaly Lungs: Normal respiratory effort; no tactile fremitus CV: RRR; no palpable thrills; no pitting edema GI: Abd soft, obese, old supraumbilical incision, palpable bulge to right of that incision, mild cellulitis, mild TTP in that area; no palpable hepatosplenomegaly MSK: Normal gait; no clubbing/cyanosis Psychiatric: Appropriate affect; alert and oriented x3 Lymphatic: No palpable cervical or axillary lymphadenopathy Skin:no rash/edema/lesion; some brawny LE skin changes  Results for orders placed or performed during the hospital encounter of 01/07/21 (from the past 48 hour(s))  Comprehensive  metabolic panel     Status: Abnormal   Collection Time: 01/07/21  9:34 AM  Result Value Ref Range   Sodium 139 135 - 145 mmol/L   Potassium 4.1 3.5 - 5.1 mmol/L   Chloride 102 98 - 111 mmol/L   CO2 28 22 - 32 mmol/L   Glucose, Bld 109 (H) 70 - 99 mg/dL    Comment: Glucose reference range applies only to samples taken after fasting for at least 8 hours.   BUN 15 6 - 20 mg/dL   Creatinine, Ser 2.19 0.61 - 1.24 mg/dL   Calcium 9.9 8.9 - 75.8 mg/dL   Total Protein 8.0 6.5 - 8.1 g/dL   Albumin 4.3 3.5 - 5.0 g/dL   AST 23 15 - 41 U/L   ALT 20 0 - 44 U/L   Alkaline Phosphatase 50 38 - 126 U/L   Total Bilirubin 1.2 0.3 - 1.2 mg/dL   GFR, Estimated >83 >25 mL/min    Comment: (NOTE) Calculated using the CKD-EPI Creatinine Equation (2021)    Anion gap 9 5 - 15    Comment: Performed at [facility], [address] [place], [place] [address]  CBC with Differential     Status: None   Collection Time: 01/07/21  9:34 AM  Result Value Ref Range   WBC 5.3 4.0 - 10.5 K/uL   RBC 5.81 4.22 - 5.81 MIL/uL   Hemoglobin 16.7 13.0 - 17.0 g/dL   HCT 41.5 83.0 - 94.0 %   MCV 87.4 80.0 - 100.0 fL   MCH 28.7 26.0 - 34.0 pg   MCHC 32.9 30.0 - 36.0 g/dL   RDW 76.8 08.8 - 11.0 %   Platelets 187 150 - 400 K/uL   nRBC 0.0 0.0 - 0.2 %   Neutrophils Relative % 60 %   Neutro Abs 3.2 1.7 - 7.7 K/uL   Lymphocytes Relative 26 %   Lymphs Abs 1.4 0.7 - 4.0 K/uL   Monocytes Relative 8 %   Monocytes Absolute 0.4 0.1 - 1.0 K/uL   Eosinophils Relative 5 %   Eosinophils Absolute 0.3 0.0 - 0.5 K/uL   Basophils Relative 1 %   Basophils Absolute 0.1 0.0 - 0.1 K/uL   Immature Granulocytes 0 %   Abs Immature Granulocytes 0.01 0.00 - 0.07 K/uL    Comment: Performed at [facility], [address] [place], [place] [address]  Lipase, blood     Status: None   Collection Time: 01/07/21  9:34 AM  Result Value Ref Range   Lipase 34 11 - 51 U/L    Comment: Performed at [facility], [address], [place], [place] [address]    CT Abdomen Pelvis W Contrast  Result Date: 01/07/2021 CLINICAL DATA:  Prior hernia repair, appendectomy. Discomfort at hernia area EXAM: CT ABDOMEN AND PELVIS WITH CONTRAST TECHNIQUE: Multidetector CT imaging of the abdomen and pelvis was performed using the standard protocol following bolus  administration of intravenous contrast. Sagittal and coronal MPR images reconstructed from axial data set. CONTRAST:  OMNIPAQUE IOHEXOL 300 MG/ML  SOLN COMPARISON:  None. FINDINGS: Lower chest: Lung bases clear Hepatobiliary: 7 mm cyst RIGHT lobe liver image 16. Gallbladder and liver otherwise normal appearance Pancreas: Normal appearance Spleen: Normal appearance Adrenals/Urinary Tract: Adrenal glands, kidneys, ureters, and bladder normal appearance Stomach/Bowel: Appendix surgically absent. Stomach and bowel loops normal appearance Vascular/Lymphatic: Atherosclerotic calcifications aorta, iliac arteries, visceral arteries. Aorta normal caliber. No adenopathy. Reproductive: Unremarkable prostate gland and seminal vesicles Other: Supraumbilical ventral hernia containing fat, likely the greater omentum. No bowel herniation. Fascial defect measures 1.9 x 2.0 cm. Mild edema within the hernia sac which could reflect inflammation or incarceration. No free air or free fluid. Musculoskeletal: Degenerative disc disease changes lumbar spine with mild retrolisthesis at L2-L3 and L5-S1. IMPRESSION: Supraumbilical ventral hernia containing fat, likely the greater omentum. Mild edema within the hernia sac which could reflect inflammation or incarceration. No bowel herniation. Remainder of exam unremarkable. Aortic Atherosclerosis (ICD10-I70.0). Electronically Signed   By: Ulyses Southward M.D.   On: 01/07/2021 11:23    Imaging: Personally reviewed Defect about 2x3cm  A/P: Jake Martin is an 60 y.o. male with  Recurrent incarcerated fat-containing  supraumbilical hernia Obesity Obstructive sleep apnea on CPAP Hypertension Hyperlipidemia  He has a recurrent supraumbilical hernia.  It is incarcerated with a large amount of omentum.  He believes he has had a mesh repair in the past.  I really cannot appreciate any mesh on CT.  His incision is only about 3 inches.  Because of his mild cellulitis I think we need to go ahead and operate assuming his Covid test is negative  Unfortunately because the cellulitis we will only be able to do a primary repair.  I discussed with him and his wife that since this is a recurrent hernia, the fascial defect is at least 2 cm x 2 cm, and his current weight he would be at a high risk for recurrence.  We discussed that generally mesh insertion is standard of care however with his soft tissue cellulitis I would be reluctant to place in a permanent implant because of the risk of infection.  I think it be less morbid to have a hernia recurrence then to develop a mesh infection  We discussed the risk and benefits of surgery including but not limited to bleeding, infection, injury to surrounding structures, hernia recurrence,  hematoma/seroma formation, wound infection, blood clot formation, urinary retention, post operative ileus, general anesthesia risk, long-term abdominal pain. We discussed that this procedure can be quite uncomfortable. We discussed the importance of avoiding heavy lifting and straining for a period of 6 weeks.  I recommended a laparoscopic assisted approach that way we could hopefully minimize needing to enlarge his current fascial defect larger in order to reduce the omentum.  I would like to reduce the incarcerated omentum laparoscopic and then convert to open approach and do a primary muscle repair   Mary Sella. Andrey Campanile, MD, FACS General, Bariatric, & Minimally Invasive Surgery [facility], [place]

## 2021-01-07 NOTE — Anesthesia Preprocedure Evaluation (Addendum)
Anesthesia Evaluation  Patient identified by MRN, date of birth, ID band Patient awake    Reviewed: Allergy & Precautions, NPO status , Patient's Chart, lab work & pertinent test results  Airway Mallampati: II  TM Distance: >3 FB Neck ROM: Full    Dental  (+) Dental Advisory Given, Chipped, Missing, Poor Dentition,    Pulmonary sleep apnea and Continuous Positive Airway Pressure Ventilation ,    breath sounds clear to auscultation       Cardiovascular hypertension, Pt. on medications  Rhythm:Regular Rate:Normal     Neuro/Psych  Neuromuscular disease negative psych ROS   GI/Hepatic negative GI ROS, Neg liver ROS,   Endo/Other  negative endocrine ROS  Renal/GU negative Renal ROS     Musculoskeletal negative musculoskeletal ROS (+)   Abdominal Normal abdominal exam  (+)   Peds  Hematology negative hematology ROS (+)   Anesthesia Other Findings - HLD  Reproductive/Obstetrics                            Anesthesia Physical Anesthesia Plan  ASA: III  Anesthesia Plan: General   Post-op Pain Management:    Induction: Intravenous, Rapid sequence and Cricoid pressure planned  PONV Risk Score and Plan: 3 and Ondansetron, Dexamethasone and Midazolam  Airway Management Planned: Oral ETT  Additional Equipment: None  Intra-op Plan:   Post-operative Plan: Extubation in OR  Informed Consent: I have reviewed the patients History and Physical, chart, labs and discussed the procedure including the risks, benefits and alternatives for the proposed anesthesia with the patient or authorized representative who has indicated his/her understanding and acceptance.     Dental advisory given  Plan Discussed with: CRNA  Anesthesia Plan Comments:        Anesthesia Quick Evaluation

## 2021-01-07 NOTE — Anesthesia Procedure Notes (Signed)
Procedure Name: Intubation Date/Time: 01/07/2021 3:15 PM Performed by: Mitzie Na, CRNA Pre-anesthesia Checklist: Patient identified, Emergency Drugs available, Suction available and Patient being monitored Patient Re-evaluated:Patient Re-evaluated prior to induction Oxygen Delivery Method: Circle system utilized Preoxygenation: Pre-oxygenation with 100% oxygen Induction Type: IV induction and Rapid sequence Laryngoscope Size: Mac and 4 Grade View: Grade I Tube type: Oral Tube size: 7.5 mm Number of attempts: 1 Airway Equipment and Method: Stylet Placement Confirmation: ETT inserted through vocal cords under direct vision,  positive ETCO2 and breath sounds checked- equal and bilateral Secured at: 25 cm Tube secured with: Tape Dental Injury: Teeth and Oropharynx as per pre-operative assessment

## 2021-01-07 NOTE — Anesthesia Postprocedure Evaluation (Signed)
Anesthesia Post Note  Patient: Jake Martin  Procedure(s) Performed: diagnostic laparoscopy open repair recurrent incarcerated ventral hernia (Bilateral Abdomen)     Patient location during evaluation: PACU Anesthesia Type: General Level of consciousness: awake and alert Pain management: pain level controlled Vital Signs Assessment: post-procedure vital signs reviewed and stable Respiratory status: spontaneous breathing, nonlabored ventilation, respiratory function stable and patient connected to nasal cannula oxygen Cardiovascular status: blood pressure returned to baseline and stable Postop Assessment: no apparent nausea or vomiting Anesthetic complications: no   No complications documented.  Last Vitals:  Vitals:   01/07/21 1730 01/07/21 1745  BP: 137/73 133/73  Pulse: (!) 59 61  Resp: 10 12  Temp: (!) 36.2 C   SpO2: 93% 96%    Last Pain:  Vitals:   01/07/21 1745  TempSrc:   PainSc: 3                  Shelton Silvas

## 2021-01-07 NOTE — Op Note (Signed)
Jake Martin [id number] [birth year] 01/07/2021   Diagnostic laparoscopy, Open Primary Repair of Incarcerated Recurrent Supraumbilical Hernia  Procedure Note, laparoscopic bilateral TAP block  Indications: Symptomatic incarcerated recurrent supraumbilical hernia.  He had previously undergone an open repair of an supraumbilical hernia in 2004.  He thought the use mesh.  He has had a recurrence since at least before the initial Covid pandemic started.  He has generally been able to reduce it.  He was unable to reduce it since last evening.  He came to the emergency room.  Had some mild cellulitis.  Because of the cellulitis I recommended proceeding to the operating room for repair.  Please see notes for additional detail  Pre-operative Diagnosis: incarcerated recurrent supraumbilical hernia  Post-operative Diagnosis: incarcerated recurrent supraumbilical hernia  Surgeon: Gaynelle Adu MD FACS  Assistants: none  Anesthesia: General endotracheal anesthesia  Specimen: incarcerated omentum-discarded   Procedure Details  The patient was seen in the Holding Room. The risks, benefits, complications, treatment options, and expected outcomes were discussed with the patient. The possibilities of reaction to medication, pulmonary aspiration, perforation of viscus, bleeding, recurrent infection, the need for additional procedures, failure to diagnose a condition, and creating a complication requiring transfusion or operation were discussed with the patient. The patient concurred with the proposed plan, giving informed consent.  The site of surgery properly noted/marked. The patient was taken to the operating room, identified as [facility]  and the procedure verified as laparoscopic assisted repair of incarcerated recurrent supraumbilical hernia. A Time Out was held and the above information confirmed.    The patient was placed supine.    Left arm tucked with the appropriate padding.  General  endotracheal anesthesia was established.  I wanted to go and laparoscopic in order to try to reduce the incarcerated omentum in order to minimize needing to enlarge the fascial incision with the pure open approach.  The abdomen was prepped with Chloraprep and draped in standard fashion.  A 5 mm Optiview was used the cannulate the peritoneal cavity in the left upper quadrant below the costal margin.  Pneumoperitoneum was obtained by insufflating CO2, maintaining a maximum pressure of 15 mmHg.  The 5 mm 30-degree laparoscopic was inserted.  There were no significant omental adhesions to the anterior abdominal wall in and around the hernia defect.   Another 5-mm port was placed in the left lateral abdominal wall.  There was a plug of omentum incarcerated into the supraumbilical hernia.  I ended up placing 1 additional 5 mm trocar in the left lower quadrant also under direct visualizatio.  I took down the falciform ligament with EndoShears with electrocautery in order to further visualize the supraumbilical fascial defect.  Using traction with an atraumatic grasper I attempted to reduce the incarcerated plug of omentum.  There was a little bit of bleeding from some sections of the omentum.  This was taken care of with a Art gallery manager with electrocautery.  I was unable to significantly reduce the herniated incarcerated omentum so therefore I switched to open at this time.   I then proceeded with the open portion of the procedure.  an elliptical incision was made with a 15 blade incorporating his old scar and extending slightly superior and inferior.  Dissection was carried down to the hernia sac located above the fascia and was mobilized from surrounding structures. Intact fascia was identified circumferentially around the defect.  The hernia sac was entered and there was a fair amount of fluid within it.  The omentum was visualized.  I ended up amputating some of the omentum that appeared a little bit of ischemic  with Kelly clamps and 2-0 silk ties.  I was then able to reduce the remaining omentum back in the abdominal cavity.  A finger sweep was performed which demonstrated no residual attachments to the undersurface of the fascia.  The hernia sac was excised and discarded.    I then measured the defect.  It measured 3 cm x 2 cm.  Since he had some cellulitis I did not feel comfortable placing mesh.  We had decided and discussed to do a primary repair only.   The fascia was then closed primarily with 5 interrupted 1-0-novafil sutures in a transverse fashion.   I then returned laparoscopically.  I then infiltrated Exparel in a regional fashion in the preperitoneal space around the umbilical fascial closure and along bilateral abdominal walls as a tap block using laparoscopic guidance.  There is one area of a little bit of diastases on the left side of the abdominal wall closure so I placed an interrupted 0 Novafil using an Endo Close suture passer.  We inspected for hemostasis.  Hemostasis was achieved by using [facility] with cautery on some of the reduced omentum.  Pneumoperitoneum was then released as we removed the remainder of the trocars.   The supraumbilical cavity was irrigated.  I reapproximated some of the deep tissue with inverted interrupted 3-0 Vicryl sutures.  The skin at the umbilicus was closed with skin staples.  The port sites were closed with 4-0 Monocryl followed by steri-strips and bandages.  An abdominal binder was placed around the patient's abdomen.  The patient was extubated and brought to the recovery room in stable condition.  All sponge, instrument, and needle counts were correct prior to closure and at the conclusion of the case.   Findings: Type of repair - primary suture  I saw no evidence of prior mesh insertion  Estimated Blood Loss:  25cc         Complications:  None; patient tolerated the procedure well.         Disposition: PACU - hemodynamically stable.          Condition: stable  Mary Sella. Andrey Campanile, MD, FACS General, Bariatric, & Minimally Invasive Surgery [facility], [place]

## 2021-01-07 NOTE — ED Notes (Signed)
CCS at bedside 

## 2021-01-07 NOTE — Progress Notes (Signed)
Pharmacy Note  60 y/o M to undergo surgery later today for incarcerated fat-containing hernia with cellulitis. Pharmacy consulted to dose vancomycin preoperatively.   O:  - adjuted body weight: 96 kg - SCr 0.76 - CrCl > 100 ml/min  A/P:  Will order vancomycin 2000 mg IV once. Please reconsult pharmacy if continued dosing is required postoperatively. Thank you for the consult.   Luisa Hart D  01/07/21 1:53 PM

## 2021-01-07 NOTE — ED Triage Notes (Signed)
Patient complaining of a hernia that he is having discomfort with. Patient states that it started early this am.

## 2021-01-07 NOTE — ED Provider Notes (Signed)
[facility]-EMERGENCY DEPT Provider Note   CSN: [id number] Arrival date & time: 01/07/21  0840     History Chief Complaint  Patient presents with  . Hernia    Jake Martin is a 60 y.o. male.  HPI   60 year old male with a history of carpal tunnel syndrome, umbilical hernia, hyperlipidemia, hypertension, obesity, OSA, who presents to the emergency department today for evaluation of a hernia.  States he has a history of an umbilical hernia that has been repaired in the past.  States he started having more trouble with it about 2 years ago and in the last several months it has gotten worse.  Normally he is able to reduce it himself however since last night he has been unable to reduce the hernia even with significant amount of pressure.  He has developed a little bit of redness around it and has some discomfort.  He denies any nausea, vomiting, diarrhea, constipation states he has been passing flatus still.  He denies any associated fevers.  Past Medical History:  Diagnosis Date  . Carpal tunnel syndrome, right   . Hernia    umbilical hernia  . Hyperlipidemia    no meds /diet   . Hypertension   . Obesity   . OSA on CPAP   . Pneumonia     Patient Active Problem List   Diagnosis Date Noted  . Dyspnea 12/11/2012  . Snoring 12/11/2012  . Morbid obesity with BMI of 40.0-44.9, adult (HCC) 12/11/2012    Past Surgical History:  Procedure Laterality Date  . APPENDECTOMY  2005  . EYE SURGERY  1967-1968   Bil  . HERNIA REPAIR  2004  . WISDOM TOOTH EXTRACTION         Family History  Problem Relation Age of Onset  . Hypertension Sister   . COPD Father   . Hypertension Father   . Aneurysm Mother        BRAIN  . Diabetes Other   . Thyroid disease Other   . Diabetes type II Other        GRANDFATHER  . Coronary artery disease Other        GRANDMOTHER  . Diabetes Paternal Grandmother   . Diabetes Son     Social History   Tobacco Use  .  Smoking status: Never Smoker  . Smokeless tobacco: Current User    Types: Chew  . Tobacco comment: Chews daily  Vaping Use  . Vaping Use: Never used  Substance Use Topics  . Alcohol use: Yes    Alcohol/week: 2.0 standard drinks    Types: 2 Standard drinks or equivalent per week    Comment: wine 3-5 glasses weekly  . Drug use: No    Home Medications Prior to Admission medications   Medication Sig Start Date End Date Taking? Authorizing Provider  amLODipine (NORVASC) 2.5 MG tablet Take 2.5 mg by mouth daily.   Yes [provider]  Aspirin-Acetaminophen 500-325 MG PACK Take 1 packet by mouth every 6 (six) hours as needed (headaches).   Yes [provider]  hydrochlorothiazide (HYDRODIURIL) 25 MG tablet Take 25 mg by mouth daily.   Yes [provider]  ibuprofen (ADVIL,MOTRIN) 200 MG tablet Take 400 mg by mouth every 6 (six) hours as needed for mild pain.   Yes [provider]  Multiple Vitamins-Minerals (MULTIVITAMIN WITH MINERALS) tablet Take 1 tablet by mouth daily.   Yes [provider]  SYMBICORT 160-4.5 MCG/ACT inhaler Inhale 2 puffs  into the lungs in the morning and at bedtime. 12/19/20  Yes [provider]    Allergies    Lisinopril  Review of Systems   Review of Systems  Constitutional: Negative for fever.  HENT: Negative for ear pain and sore throat.   Eyes: Negative for pain and visual disturbance.  Respiratory: Negative for cough and shortness of breath.   Cardiovascular: Negative for chest pain.  Gastrointestinal: Positive for abdominal pain. Negative for constipation, diarrhea, nausea and vomiting.  Genitourinary: Negative for dysuria and hematuria.  Musculoskeletal: Negative for back pain.  Skin: Negative for rash.  Neurological: Negative for headaches.  All other systems reviewed and are negative.   Physical Exam Updated Vital Signs BP 136/80   Pulse (!) 51   Temp 97.9 F (36.6 C) (Oral)   Resp 16   Ht  5\' 11"  (1.803 m)   Wt 127 kg   SpO2 96%   BMI 39.05 kg/m   Physical Exam Vitals and nursing note reviewed.  Constitutional:      Appearance: He is well-developed and well-nourished.  HENT:     Head: Normocephalic and atraumatic.  Eyes:     Conjunctiva/sclera: Conjunctivae normal.  Cardiovascular:     Rate and Rhythm: Normal rate and regular rhythm.     Heart sounds: Normal heart sounds. No murmur heard.   Pulmonary:     Effort: Pulmonary effort is normal. No respiratory distress.     Breath sounds: Normal breath sounds. No wheezing, rhonchi or rales.  Abdominal:     General: Bowel sounds are normal.     Palpations: Abdomen is soft.     Hernia: A hernia (umbilical hernia noted that is somewhat firm and mildly erythematous) is present.     Comments: Very mild discomfort over the umbilical hernia. No guarding noted  Musculoskeletal:        General: No edema.     Cervical back: Neck supple.  Skin:    General: Skin is warm and dry.  Neurological:     Mental Status: He is alert.  Psychiatric:        Mood and Affect: Mood and affect normal.     ED Results / Procedures / Treatments   Labs (all labs ordered are listed, but only abnormal results are displayed) Labs Reviewed  COMPREHENSIVE METABOLIC PANEL - Abnormal; Notable for the following components:      Result Value   Glucose, Bld 109 (*)    All other components within normal limits  RESP PANEL BY RT-PCR (FLU A&B, COVID) ARPGX2  CBC WITH DIFFERENTIAL/PLATELET  LIPASE, BLOOD    EKG None  Radiology CT Abdomen Pelvis W Contrast  Result Date: 01/07/2021 CLINICAL DATA:  Prior hernia repair, appendectomy. Discomfort at hernia area EXAM: CT ABDOMEN AND PELVIS WITH CONTRAST TECHNIQUE: Multidetector CT imaging of the abdomen and pelvis was performed using the standard protocol following bolus administration of intravenous contrast. Sagittal and coronal MPR images reconstructed from axial data set. CONTRAST:  01/09/2021  OMNIPAQUE IOHEXOL 300 MG/ML  SOLN COMPARISON:  None. FINDINGS: Lower chest: Lung bases clear Hepatobiliary: 7 mm cyst RIGHT lobe liver image 16. Gallbladder and liver otherwise normal appearance Pancreas: Normal appearance Spleen: Normal appearance Adrenals/Urinary Tract: Adrenal glands, kidneys, ureters, and bladder normal appearance Stomach/Bowel: Appendix surgically absent. Stomach and bowel loops normal appearance Vascular/Lymphatic: Atherosclerotic calcifications aorta, iliac arteries, visceral arteries. Aorta normal caliber. No adenopathy. Reproductive: Unremarkable prostate gland and seminal vesicles Other: Supraumbilical ventral hernia containing fat, likely the greater omentum.  No bowel herniation. Fascial defect measures 1.9 x 2.0 cm. Mild edema within the hernia sac which could reflect inflammation or incarceration. No free air or free fluid. Musculoskeletal: Degenerative disc disease changes lumbar spine with mild retrolisthesis at L2-L3 and L5-S1. IMPRESSION: Supraumbilical ventral hernia containing fat, likely the greater omentum. Mild edema within the hernia sac which could reflect inflammation or incarceration. No bowel herniation. Remainder of exam unremarkable. Aortic Atherosclerosis (ICD10-I70.0). Electronically Signed   By: Ulyses Southward M.D.   On: 01/07/2021 11:23    Procedures Procedures   Medications Ordered in ED Medications  vancomycin (VANCOREADY) IVPB 2000 mg/400 mL (has no administration in time range)  sodium chloride 0.9 % bolus 1,000 mL (0 mLs Intravenous Stopped 01/07/21 1040)  iohexol (OMNIPAQUE) 300 MG/ML solution 100 mL (100 mLs Intravenous Contrast Given 01/07/21 1100)    ED Course  I have reviewed the triage vital signs and the nursing notes.  Pertinent labs & imaging results that were available during my care of the patient were reviewed by me and considered in my medical decision making (see chart for details).    MDM Rules/Calculators/A&P                           60 y/o M presenting for eval of umbilical hernia that he is unable to reduce at home  Reviewed/interpreted labs  CBC wnl CMP unremarkable Lipase wnl COVID neg  Reviewed/interpreted imaging CT abd/pelvis - Supraumbilical ventral hernia containing fat, likely the greater omentum. Mild edema within the hernia sac which could reflect inflammation or incarceration. No bowel herniation. Remainder of exam unremarkable. Aortic Atherosclerosis  11:40 PM CONSULT With Dr. Andrey Campanile who will evaluate the patient at bedside.   Dr Andrey Campanile evaluated pt at bedside. States if COVID neg pt will be admitted for surgery.   Pt COVID neg, admitted by [facility] and taken to OR.   Final Clinical Impression(s) / ED Diagnoses Final diagnoses:  Hernia of abdominal cavity    Rx / DC Orders ED Discharge Orders    None       Karrie Meres, PA-C 01/07/21 1514    Tegeler, Canary Brim, MD 01/07/21 1547

## 2021-01-07 NOTE — Transfer of Care (Signed)
Immediate Anesthesia Transfer of Care Note  Patient: Jake Martin  Procedure(s) Performed: diagnostic laparoscopy open repair recurrent incarcerated ventral hernia (Bilateral Abdomen)  Patient Location: PACU  Anesthesia Type:General  Level of Consciousness: awake, alert , oriented and patient cooperative  Airway & Oxygen Therapy: Patient Spontanous Breathing and Patient connected to face mask oxygen  Post-op Assessment: Report given to RN, Post -op Vital signs reviewed and stable and Patient moving all extremities  Post vital signs: Reviewed and stable  Last Vitals:  Vitals Value Taken Time  BP    Temp    Pulse    Resp 14 01/07/21 1654  SpO2    Vitals shown include unvalidated device data.  Last Pain:  Vitals:   01/07/21 0858  TempSrc:   PainSc: 2       Patients Stated Pain Goal: 0 (01/07/21 0858)  Complications: No complications documented.

## 2021-01-08 ENCOUNTER — Encounter (HOSPITAL_COMMUNITY): Payer: Self-pay | Admitting: General Surgery

## 2021-01-08 DIAGNOSIS — F17220 Nicotine dependence, chewing tobacco, uncomplicated: Secondary | ICD-10-CM | POA: Diagnosis not present

## 2021-01-08 DIAGNOSIS — Z20822 Contact with and (suspected) exposure to covid-19: Secondary | ICD-10-CM | POA: Diagnosis not present

## 2021-01-08 DIAGNOSIS — I10 Essential (primary) hypertension: Secondary | ICD-10-CM | POA: Diagnosis not present

## 2021-01-08 DIAGNOSIS — K420 Umbilical hernia with obstruction, without gangrene: Secondary | ICD-10-CM | POA: Diagnosis not present

## 2021-01-08 LAB — BASIC METABOLIC PANEL
Anion gap: 8 (ref 5–15)
BUN: 11 mg/dL (ref 6–20)
CO2: 26 mmol/L (ref 22–32)
Calcium: 9.1 mg/dL (ref 8.9–10.3)
Chloride: 103 mmol/L (ref 98–111)
Creatinine, Ser: 0.62 mg/dL (ref 0.61–1.24)
GFR, Estimated: >60 mL/min (ref >60)
Glucose, Bld: 145 mg/dL — ABNORMAL HIGH (ref 70–99)
Potassium: 4.1 mmol/L (ref 3.5–5.1)
Sodium: 137 mmol/L (ref 135–145)

## 2021-01-08 LAB — CBC
HCT: 45.9 % (ref 39.0–52.0)
Hemoglobin: 14.9 g/dL (ref 13.0–17.0)
MCH: 28.5 pg (ref 26.0–34.0)
MCHC: 32.5 g/dL (ref 30.0–36.0)
MCV: 87.9 fL (ref 80.0–100.0)
Platelets: 180 10*3/uL (ref 150–400)
RBC: 5.22 MIL/uL (ref 4.22–5.81)
RDW: 14.2 % (ref 11.5–15.5)
WBC: 10.3 10*3/uL (ref 4.0–10.5)
nRBC: 0 % (ref 0.0–0.2)

## 2021-01-08 MED ORDER — IBUPROFEN 800 MG PO TABS: 800.0000 mg | ORAL_TABLET | Freq: Three times a day (TID) | ORAL | 0 refills | Status: DC | PRN

## 2021-01-08 MED ORDER — HYDROCODONE-ACETAMINOPHEN 5-325 MG PO TABS: 1.0000 | ORAL_TABLET | Freq: Four times a day (QID) | ORAL | 0 refills | Status: DC | PRN

## 2021-01-08 NOTE — Discharge Summary (Signed)
Physician Discharge Summary  Patient ID: Jake Martin MRN: [id number] DOB/AGE: September 01, 1961 60 y.o.  Admit date: 01/07/2021 Discharge date: 01/08/2021  Admission Diagnoses: Incarcerated ventral hernia  Discharge Diagnoses: Same Active Problems:   S/P hernia repair   Discharged Condition: good  Hospital Course: Patient admitted for emergency repair of incarcerated ventral hernia.  This was performed by Dr. Andrey Campanile.  This was done with a primary repair no mesh.  He did well with surgery.  He had no bowel incarceration noted with some omental incarceration noted.  Postop day 1 he is tolerating his diet had good pain control and was ambulating without difficulty.  His wounds are clean dry and intact his abdomen his abdomen soft nondistended.  He was discharged home on postoperative day 1 in good condition      Treatments: surgery: Open repair incisional hernia with mesh  Discharge Exam: Blood pressure 123/83, pulse (!) 56, temperature 98.1 F (36.7 C), temperature source Oral, resp. rate 16, height 5\' 11"  (1.803 m), weight 127 kg, SpO2 93 %. General appearance: alert and cooperative Head: Normocephalic, without obvious abnormality, atraumatic Resp: clear to auscultation bilaterally Cardio: regular rate and rhythm Incision/Wound: Abdomen soft nontender.  Dressings are clean and dry.  Wound is clean dry and intact without any excessive erythema.  Of note there is some baseline erythema in this appears to be improving.  Port sites are clean dry intact  Disposition: Discharge disposition: 01-Home or Self Care       Discharge Instructions    Diet - low sodium heart healthy   Complete by: As directed    Increase activity slowly   Complete by: As directed      Allergies as of 01/08/2021      Reactions   Lisinopril Swelling   Lips and eyes swell      Medication List    TAKE these medications   amLODipine 2.5 MG tablet Commonly known as: NORVASC Take 2.5 mg by mouth  daily.   Aspirin-Acetaminophen 500-325 MG Pack Take 1 packet by mouth every 6 (six) hours as needed (headaches).   hydrochlorothiazide 25 MG tablet Commonly known as: HYDRODIURIL Take 25 mg by mouth daily.   HYDROcodone-acetaminophen 5-325 MG tablet Commonly known as: NORCO/VICODIN Take 1 tablet by mouth every 6 (six) hours as needed for moderate pain.   ibuprofen 200 MG tablet Commonly known as: ADVIL Take 400 mg by mouth every 6 (six) hours as needed for mild pain. What changed: Another medication with the same name was added. Make sure you understand how and when to take each.   ibuprofen 800 MG tablet Commonly known as: ADVIL Take 1 tablet (800 mg total) by mouth every 8 (eight) hours as needed. What changed: You were already taking a medication with the same name, and this prescription was added. Make sure you understand how and when to take each.   multivitamin with minerals tablet Take 1 tablet by mouth daily.   Symbicort 160-4.5 MCG/ACT inhaler Generic drug: budesonide-formoterol Inhale 2 puffs into the lungs in the morning and at bedtime.       Follow-up Information    [facility] 01/10/2021. Schedule an appointment as soon as possible for a visit in 3 weeks.   Specialty: General Surgery Why: For wound re-check; with [facility] Contact information: [address] [place] [phone]               Signed: [phone] Corryn Madewell MD  01/08/2021, 10:01 AM

## 2021-01-08 NOTE — Discharge Instructions (Signed)
[facility]  UMBILICAL OR INGUINAL HERNIA REPAIR: POST OP INSTRUCTIONS  Always review your discharge instruction sheet given to you by the facility where your surgery was performed. IF YOU HAVE DISABILITY OR FAMILY LEAVE FORMS, YOU MUST BRING THEM TO THE OFFICE FOR PROCESSING.   DO NOT GIVE THEM TO YOUR DOCTOR.  1. A  prescription for pain medication may be given to you upon discharge.  Take your pain medication as prescribed, if needed.  If narcotic pain medicine is not needed, then you may take acetaminophen (Tylenol) or ibuprofen (Advil) as needed. 2. Take your usually prescribed medications unless otherwise directed. If you need a refill on your pain medication, please contact your pharmacy.  They will contact our office to request authorization. Prescriptions will not be filled after 5 pm or on week-ends. 3. You should follow a light diet the first 24 hours after arrival home, such as soup and crackers, etc.  Be sure to include lots of fluids daily.  Resume your normal diet the day after surgery. 4.Most patients will experience some swelling and bruising around the umbilicus or in the groin and scrotum.  Ice packs and reclining will help.  Swelling and bruising can take several days to resolve.  6. It is common to experience some constipation if taking pain medication after surgery.  Increasing fluid intake and taking a stool softener (such as Colace) will usually help or prevent this problem from occurring.  A mild laxative (Milk of Magnesia or Miralax) should be taken according to package directions if there are no bowel movements after 48 hours. 7. Unless discharge instructions indicate otherwise, you may remove your bandages 24-48 hours after surgery, and you may shower at that time.  You may have steri-strips (small skin tapes) in place directly over the incision.  These strips should be left on the skin for 7-10 days.  If your surgeon used skin glue on the  incision, you may shower in 24 hours.  The glue will flake off over the next 2-3 weeks.  Any sutures or staples will be removed at the office during your follow-up visit. 8. ACTIVITIES:  You may resume regular (light) daily activities beginning the next day--such as daily self-care, walking, climbing stairs--gradually increasing activities as tolerated.  You may have sexual intercourse when it is comfortable.  Refrain from any heavy lifting or straining until approved by your doctor.  a.You may drive when you are no longer taking prescription pain medication, you can comfortably wear a seatbelt, and you can safely maneuver your car and apply brakes. b.RETURN TO WORK:   _____________________________________________  9.You should see your doctor in the office for a follow-up appointment approximately 2-3 weeks after your surgery.  Make sure that you call for this appointment within a day or two after you arrive home to insure a convenient appointment time. 10.OTHER INSTRUCTIONS: _________________________    _____________________________________  WHEN TO CALL YOUR DOCTOR: 1. Fever over 101.0 2. Inability to urinate 3. Nausea and/or vomiting 4. Extreme swelling or bruising 5. Continued bleeding from incision. 6. Increased pain, redness, or drainage from the incision  The clinic staff is available to answer your questions during regular business hours.  Please don't hesitate to call and ask to speak to one of the nurses for clinical concerns.  If you have a medical emergency, go to the nearest emergency room or call 911.  A surgeon from [facility] is always on call at the hospital     [address], [address], [place], [place]  [address] ?  [address] [address], [place], [place]   [address] [phone] ? [phone] ? FAX [phone] Web site: [email or web address]    Remove dressing in 24 hours and shower.  No lifting more than 10 pounds for 4 weeks  May drive when  comfortable in 2 to 3 days

## 2021-02-09 ENCOUNTER — Other Ambulatory Visit (HOSPITAL_BASED_OUTPATIENT_CLINIC_OR_DEPARTMENT_OTHER): Payer: Self-pay

## 2021-04-12 ENCOUNTER — Ambulatory Visit: Payer: 59 | Admitting: Adult Health

## 2021-04-12 ENCOUNTER — Other Ambulatory Visit: Payer: Self-pay

## 2021-04-12 ENCOUNTER — Encounter: Payer: Self-pay | Admitting: Adult Health

## 2021-04-12 VITALS — BP 156/84 | HR 67 | Ht 70.0 in | Wt 295.0 lb

## 2021-04-12 DIAGNOSIS — Z9989 Dependence on other enabling machines and devices: Secondary | ICD-10-CM | POA: Diagnosis not present

## 2021-04-12 DIAGNOSIS — G4733 Obstructive sleep apnea (adult) (pediatric): Secondary | ICD-10-CM | POA: Diagnosis not present

## 2021-04-12 NOTE — Patient Instructions (Signed)
Continue using CPAP nightly and greater than 4 hours each night °If your symptoms worsen or you develop new symptoms please let us know.  ° °

## 2021-04-12 NOTE — Progress Notes (Addendum)
PATIENT: Daiwik Buffalo Vanalstyne DOB: Feb 03, 1961  REASON FOR VISIT: follow up HISTORY FROM: patient  HISTORY OF PRESENT ILLNESS: Today 04/12/21:  Mr.Vigeant is a 60 year old male with a history of obstructive sleep apnea on CPAP.  He did not bring his machine with him today we were unable to obtain a download.  We will reach out to his DME company.  He reports that he does use a CPAP nightly.  He denies any issues with the CPAP.  He returns today for an evaluation.  04/08/20: Mr. Benish is a 60 year old male with a history of obstructive sleep apnea on CPAP.  His download indicates that he use his machine nightly for compliance of 100%.  He uses machine greater than 4 hours each night.  On average he uses his machine 6 hours and 33 minutes.  His residual AHI is 1.8 on 10 cm of water with an EPR of 1.  Leak in the 95th percentile is 6.4 L/min.  Reports that the CPAP is working well for him.  He returns today for follow-up  HISTORY 01/12/20: I was not able to review his latest compliance data. We could not access his data remotely. He did not bring his machine or SD card from his machine.  He reports poor compliance with his machine, he uses a full facemask.  He needs new supplies but would like to wait until he gets his new machine, he should be eligible for new equipment at this point.  He is still debating whether to take the Covid vaccine.  His wife got vaccinated and his daughter got vaccinated as well.  Both work in the healthcare environment.  He got started on metoprolol for additional treatment of his blood pressure.   REVIEW OF SYSTEMS: Out of a complete 14 system review of symptoms, the patient complains only of the following symptoms, and all other reviewed systems are negative.  ESS 3' FSS 9  ALLERGIES: Allergies  Allergen Reactions   Lisinopril Swelling    Lips and eyes swell    HOME MEDICATIONS: Outpatient Medications Prior to Visit  Medication Sig Dispense Refill    amLODipine (NORVASC) 10 MG tablet TAKE 1 TABLET BY MOUTH DAILY 90 tablet 2   Aspirin-Acetaminophen 500-325 MG PACK Take 1 packet by mouth every 6 (six) hours as needed (headaches).     budesonide-formoterol (SYMBICORT) 160-4.5 MCG/ACT inhaler INHALE TWO PUFFS BY MOUTH TWICE DAILY 10.2 g 6   hydrochlorothiazide (HYDRODIURIL) 25 MG tablet Take 25 mg by mouth daily.     Multiple Vitamins-Minerals (MULTIVITAMIN WITH MINERALS) tablet Take 1 tablet by mouth daily.     SYMBICORT 160-4.5 MCG/ACT inhaler Inhale 2 puffs into the lungs in the morning and at bedtime.     amLODipine (NORVASC) 2.5 MG tablet Take 2.5 mg by mouth daily.     hydrochlorothiazide (HYDRODIURIL) 25 MG tablet TAKE 1 TABLET BY MOUTH EVERY MORNING FOR BLOOD PRESSURE 90 tablet 2   HYDROcodone-acetaminophen (NORCO/VICODIN) 5-325 MG tablet Take 1 tablet by mouth every 6 (six) hours as needed for moderate pain. 15 tablet 0   ibuprofen (ADVIL) 800 MG tablet Take 1 tablet (800 mg total) by mouth every 8 (eight) hours as needed. 30 tablet 0   ibuprofen (ADVIL,MOTRIN) 200 MG tablet Take 400 mg by mouth every 6 (six) hours as needed for mild pain.     No facility-administered medications prior to visit.    PAST MEDICAL HISTORY: Past Medical History:  Diagnosis Date   Carpal tunnel syndrome,  right    Hernia    umbilical hernia   Hyperlipidemia    no meds /diet    Hypertension    Obesity    OSA on CPAP    Pneumonia     PAST SURGICAL HISTORY: Past Surgical History:  Procedure Laterality Date   APPENDECTOMY  2005   EYE SURGERY  1967-1968   Bil   HERNIA REPAIR  2004   LAPAROSCOPIC ASSISTED VENTRAL HERNIA REPAIR Bilateral 01/07/2021   Procedure: diagnostic laparoscopy open repair recurrent incarcerated ventral hernia;  Surgeon: Gaynelle Adu, MD;  Location: [facility];  Service: General;  Laterality: Bilateral;   WISDOM TOOTH EXTRACTION      FAMILY HISTORY: Family History  Problem Relation Age of Onset   Hypertension Sister    COPD  Father    Hypertension Father    Aneurysm Mother        BRAIN   Diabetes Other    Thyroid disease Other    Diabetes type II Other        GRANDFATHER   Coronary artery disease Other        GRANDMOTHER   Diabetes Paternal Grandmother    Diabetes Son     SOCIAL HISTORY: Social History   Socioeconomic History   Marital status: Married    Spouse name: [name]   Number of children: 2   Years of education: 15   Highest education level: Not on file  Occupational History    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  Tobacco Use   Smoking status: Never Smoker   Smokeless tobacco: Current User    Types: Chew   Tobacco comment: Chews daily  Vaping Use   Vaping Use: Never used  Substance and Sexual Activity   Alcohol use: Yes    Alcohol/week: 2.0 standard drinks    Types: 2 Standard drinks or equivalent per week    Comment: wine 3-5 glasses weekly   Drug use: No   Sexual activity: Not on file  Other Topics Concern   Not on file  Social History Narrative   Right handed and consumes 3-5 cups of caffeine daily   Social Determinants of Health   Financial Resource Strain: Not on file  Food Insecurity: Not on file  Transportation Needs: Not on file  Physical Activity: Not on file  Stress: Not on file  Social Connections: Not on file  Intimate Partner Violence: Not on file      PHYSICAL EXAM  Vitals:   04/12/21 0847  BP: (!) 156/84  Pulse: 67  Weight: 295 lb (133.8 kg)  Height: 5\' 10"  (1.778 m)   Body mass index is 42.33 kg/m.  Generalized: Well developed, in no acute distress  Chest: Lungs clear to auscultation bilaterally  Neurological examination  Mentation: Alert oriented to time, place, history taking. Follows all commands speech and language fluent Cranial nerve II-XII: Extraocular movements were full, visual field were full on confrontational test Head turning and shoulder shrug  were normal and symmetric. Motor: The motor testing reveals 5 over 5 strength of all 4  extremities. Good symmetric motor tone is noted throughout.  Sensory: Sensory testing is intact to soft touch on all 4 extremities. No evidence of extinction is noted.  Gait and station: Gait is normal.    DIAGNOSTIC DATA (LABS, IMAGING, TESTING) - I reviewed patient records, labs, notes, testing and imaging myself where available.     ASSESSMENT AND PLAN 60 y.o. year old male  has a past medical history of Carpal  tunnel syndrome, right, Hernia, Hyperlipidemia, Hypertension, Obesity, OSA on CPAP, and Pneumonia. here with:  OSA on CPAP  -We will try to get a report from his DME company - Encourage patient to use CPAP nightly and > 4 hours each night - F/U in 1 year or sooner if needed    Butch Penny, MSN, NP-C 04/12/2021, 9:02 AM [facility] [address] [place], [place] [address] [phone]  I reviewed the above note and documentation by the Nurse Practitioner and agree with the history, exam, assessment and plan as outlined above. I was available for consultation. Huston Foley, MD, PhD [facility])

## 2021-05-05 ENCOUNTER — Other Ambulatory Visit (HOSPITAL_COMMUNITY): Payer: Self-pay

## 2021-05-05 MED FILL — Amlodipine Besylate Tab 10 MG (Base Equivalent): ORAL | 90 days supply | Qty: 90 | Fill #0 | Status: AC

## 2021-05-10 ENCOUNTER — Other Ambulatory Visit (HOSPITAL_BASED_OUTPATIENT_CLINIC_OR_DEPARTMENT_OTHER): Payer: Self-pay

## 2021-05-10 ENCOUNTER — Other Ambulatory Visit (HOSPITAL_COMMUNITY): Payer: Self-pay

## 2021-05-11 ENCOUNTER — Other Ambulatory Visit (HOSPITAL_COMMUNITY): Payer: Self-pay

## 2021-05-11 MED FILL — Hydrochlorothiazide Tab 25 MG: ORAL | 90 days supply | Qty: 90 | Fill #0 | Status: AC

## 2021-07-10 DIAGNOSIS — R7301 Impaired fasting glucose: Secondary | ICD-10-CM | POA: Diagnosis not present

## 2021-07-10 DIAGNOSIS — Z125 Encounter for screening for malignant neoplasm of prostate: Secondary | ICD-10-CM | POA: Diagnosis not present

## 2021-07-10 DIAGNOSIS — E785 Hyperlipidemia, unspecified: Secondary | ICD-10-CM | POA: Diagnosis not present

## 2021-07-19 ENCOUNTER — Other Ambulatory Visit (HOSPITAL_COMMUNITY): Payer: Self-pay

## 2021-07-19 DIAGNOSIS — I872 Venous insufficiency (chronic) (peripheral): Secondary | ICD-10-CM | POA: Diagnosis not present

## 2021-07-19 DIAGNOSIS — M545 Low back pain, unspecified: Secondary | ICD-10-CM | POA: Diagnosis not present

## 2021-07-19 DIAGNOSIS — K429 Umbilical hernia without obstruction or gangrene: Secondary | ICD-10-CM | POA: Diagnosis not present

## 2021-07-19 DIAGNOSIS — R7301 Impaired fasting glucose: Secondary | ICD-10-CM | POA: Diagnosis not present

## 2021-07-19 DIAGNOSIS — I10 Essential (primary) hypertension: Secondary | ICD-10-CM | POA: Diagnosis not present

## 2021-07-19 DIAGNOSIS — Z0000 Encounter for general adult medical examination without abnormal findings: Secondary | ICD-10-CM | POA: Diagnosis not present

## 2021-07-19 DIAGNOSIS — G4733 Obstructive sleep apnea (adult) (pediatric): Secondary | ICD-10-CM | POA: Diagnosis not present

## 2021-07-19 DIAGNOSIS — E785 Hyperlipidemia, unspecified: Secondary | ICD-10-CM | POA: Diagnosis not present

## 2021-07-19 MED ORDER — METOPROLOL SUCCINATE ER 50 MG PO TB24
ORAL_TABLET | ORAL | 3 refills | Status: DC
  Filled 2021-07-19: qty 90, 90d supply, fill #0

## 2021-08-07 ENCOUNTER — Other Ambulatory Visit (HOSPITAL_COMMUNITY): Payer: Self-pay

## 2021-08-07 MED ORDER — AMLODIPINE BESYLATE 10 MG PO TABS
10.0000 mg | ORAL_TABLET | Freq: Every day | ORAL | 2 refills | Status: DC
  Filled 2021-08-07: qty 90, 90d supply, fill #0
  Filled 2021-11-06: qty 90, 90d supply, fill #1
  Filled 2022-02-11: qty 90, 90d supply, fill #2

## 2021-08-07 MED ORDER — HYDROCHLOROTHIAZIDE 25 MG PO TABS
ORAL_TABLET | ORAL | 2 refills | Status: DC
  Filled 2021-08-07: qty 90, 90d supply, fill #0
  Filled 2021-11-06: qty 90, 90d supply, fill #1
  Filled 2022-02-11: qty 90, 90d supply, fill #2

## 2021-08-17 DIAGNOSIS — G4733 Obstructive sleep apnea (adult) (pediatric): Secondary | ICD-10-CM | POA: Diagnosis not present

## 2021-10-23 ENCOUNTER — Other Ambulatory Visit (HOSPITAL_COMMUNITY): Payer: Self-pay

## 2021-10-23 MED FILL — Budesonide-Formoterol Fumarate Dihyd Aerosol 160-4.5 MCG/ACT: RESPIRATORY_TRACT | 30 days supply | Qty: 10.2 | Fill #0 | Status: AC

## 2021-10-30 ENCOUNTER — Other Ambulatory Visit (HOSPITAL_COMMUNITY): Payer: Self-pay

## 2021-11-01 ENCOUNTER — Other Ambulatory Visit (HOSPITAL_COMMUNITY): Payer: Self-pay

## 2021-11-01 DIAGNOSIS — S63391A Traumatic rupture of other ligament of right wrist, initial encounter: Secondary | ICD-10-CM | POA: Diagnosis not present

## 2021-11-01 DIAGNOSIS — U071 COVID-19: Secondary | ICD-10-CM

## 2021-11-01 DIAGNOSIS — M25531 Pain in right wrist: Secondary | ICD-10-CM | POA: Diagnosis not present

## 2021-11-01 HISTORY — DX: COVID-19: U07.1

## 2021-11-01 MED ORDER — LAGEVRIO 200 MG PO CAPS
ORAL_CAPSULE | ORAL | 0 refills | Status: DC
  Filled 2021-11-01: qty 40, 5d supply, fill #0

## 2021-11-01 MED ORDER — PROAIR RESPICLICK 108 (90 BASE) MCG/ACT IN AEPB
INHALATION_SPRAY | RESPIRATORY_TRACT | 2 refills | Status: AC
  Filled 2021-11-01: qty 1, 30d supply, fill #0

## 2021-11-03 ENCOUNTER — Other Ambulatory Visit (HOSPITAL_COMMUNITY): Payer: Self-pay

## 2021-11-07 ENCOUNTER — Other Ambulatory Visit (HOSPITAL_COMMUNITY): Payer: Self-pay

## 2021-11-15 ENCOUNTER — Other Ambulatory Visit (HOSPITAL_COMMUNITY): Payer: Self-pay

## 2021-11-15 MED ORDER — ALBUTEROL SULFATE HFA 108 (90 BASE) MCG/ACT IN AERS: INHALATION_SPRAY | RESPIRATORY_TRACT | 11 refills | Status: DC

## 2021-11-15 MED ORDER — ALBUTEROL SULFATE HFA 108 (90 BASE) MCG/ACT IN AERS
INHALATION_SPRAY | RESPIRATORY_TRACT | 11 refills | Status: DC
  Filled 2021-11-15: qty 18, 16d supply, fill #0
  Filled 2022-02-18: qty 18, 16d supply, fill #1
  Filled 2022-04-25: qty 18, 16d supply, fill #2
  Filled 2022-10-12: qty 6.7, 17d supply, fill #3
  Filled 2022-11-15: qty 6.7, 17d supply, fill #4

## 2021-11-16 ENCOUNTER — Other Ambulatory Visit: Payer: Self-pay

## 2021-11-16 ENCOUNTER — Other Ambulatory Visit (HOSPITAL_COMMUNITY): Payer: Self-pay

## 2021-11-16 ENCOUNTER — Encounter (HOSPITAL_BASED_OUTPATIENT_CLINIC_OR_DEPARTMENT_OTHER): Payer: Self-pay | Admitting: Orthopedic Surgery

## 2021-11-16 NOTE — Progress Notes (Signed)
Spoke w/ via phone for pre-op interview--- Patient Lab needs dos---- ISTAT, EKG due to HTN             Lab results------ n/a COVID test -----patient states asymptomatic no test needed Arrive at ------- 5:30am 11/23/2021 NPO after MN NO Solid Food.  Clear liquids from MN until--- 04:30 am Med rec completed Medications to take morning of surgery ----- inhalers Diabetic medication ----- n/a Patient instructed no nail polish to be worn day of surgery Patient instructed to bring photo id and insurance card day of surgery  Patient aware to have Driver (ride ) / caregiver Wife Jake Martin   for 24 hours after surgery  Patient Special Instructions ----- did not use chewing tobacco day before or morning of surgery Pre-Op special Istructions ----- Patient verbalized understanding of instructions that were given at this phone interview. Patient denies shortness of breath, chest pain, fever, cough at this phone interview.

## 2021-11-21 NOTE — H&P (Signed)
Preoperative History & Physical Exam  Surgeon: Jake Ovens, MD  Diagnosis: right wrist scapholunate ligament rupture  Planned Procedure: Procedure(s) (LRB): right wrist scapholunate ligament repair possible reconstruction (Right)  History of Present Illness:   Patient is a 61 y.o. male with symptoms consistent with  right wrist scapholunate ligament rupture who presents for surgical intervention. The risks, benefits and alternatives of surgical intervention were discussed and informed consent was obtained prior to surgery.  Past Medical History:  Past Medical History:  Diagnosis Date   Asthma    Last asthma attack about a year ago 11/16/2021   COVID 11/01/2021   cough and congestion, current cough 11/16/2022   Dyspnea    Headache    Hernia    umbilical hernia   Hyperlipidemia    Hypertension    Obesity    OSA on CPAP    Pneumonia    Wears glasses     Past Surgical History:  Past Surgical History:  Procedure Laterality Date   APPENDECTOMY  2005   EYE SURGERY  1967-1968   [facility]   HERNIA REPAIR  2004   LAPAROSCOPIC ASSISTED VENTRAL HERNIA REPAIR Bilateral 01/07/2021   Procedure: diagnostic laparoscopy open repair recurrent incarcerated ventral hernia;  Surgeon: Jake Adu, MD;  Location: [facility];  Service: General;  Laterality: Bilateral;   WISDOM TOOTH EXTRACTION      Medications:  Prior to Admission medications   Medication Sig Start Date End Date Taking? Authorizing Provider  Ascorbic Acid (VITAMIN C) 1000 MG tablet Take 1,000 mg by mouth daily.   Yes [provider]  albuterol (VENTOLIN HFA) 108 (90 Base) MCG/ACT inhaler Inhale 2 puffs by mouth every 4 hours as needed Patient taking differently: Inhale 1 puff into the lungs as needed. 11/15/21     Albuterol Sulfate (PROAIR RESPICLICK) 108 (90 Base) MCG/ACT AEPB Inhale 1 puff by mouth every 4 hours as needed Patient taking differently: as needed. Hasnt filled yet 11/16/2021 11/01/21     amLODipine  (NORVASC) 10 MG tablet TAKE 1 TABLET BY MOUTH DAILY Patient taking differently: Take 10 mg by mouth daily. At bedtime 08/07/21     Aspirin-Acetaminophen 500-325 MG PACK Take 1 packet by mouth every 6 (six) hours as needed (headaches).    [provider]  budesonide-formoterol (SYMBICORT) 160-4.5 MCG/ACT inhaler INHALE TWO PUFFS BY MOUTH INTO THE LUNGS TWICE DAILY Patient taking differently: Inhale 2 puffs into the lungs as needed. 12/18/20 12/18/21  Avva, Joylene Draft, MD  hydrochlorothiazide (HYDRODIURIL) 25 MG tablet Take 25 mg by mouth daily. At bedtime    [provider]  hydrochlorothiazide (HYDRODIURIL) 25 MG tablet TAKE 1 TABLET BY MOUTH EVERY MORNING FOR BLOOD PRESSURE 08/07/21     metoprolol succinate (TOPROL XL) 50 MG 24 hr tablet Take one tablet by mouth daily at bedtime. Patient taking differently: Take by mouth. Not taking due to symptoms 07/19/21     molnupiravir EUA (LAGEVRIO) 200 MG CAPS capsule Take 4 capsules by mouth every 12 hours for 5 days. Patient taking differently: Take by mouth. Completed dose 11/01/21     Multiple Vitamins-Minerals (MULTIVITAMIN WITH MINERALS) tablet Take 1 tablet by mouth daily.    [provider]  SYMBICORT 160-4.5 MCG/ACT inhaler Inhale 2 puffs into the lungs in the morning and at bedtime. 12/19/20   [provider]    Allergies:  Dust mite extract, Lisinopril, and Molds & smuts  Review of Systems: Negative except per HPI.  Physical Exam: Alert and oriented, NAD Head and  neck: no masses, normal alignment CV: pulse intact Pulm: no increased work of breathing, respirations even and unlabored Abdomen: non-distended Extremities: extremities warm and well perfused  LABS: No results found for this or any previous visit (from the past 2160 hour(s)).   Complete History and Physical exam available in the office notes  Jake Martin

## 2021-11-22 NOTE — Anesthesia Preprocedure Evaluation (Addendum)
Anesthesia Evaluation  Patient identified by MRN, date of birth, ID band Patient awake    Reviewed: Allergy & Precautions, NPO status , Patient's Chart, lab work & pertinent test results  Airway Mallampati: II  TM Distance: >3 FB Neck ROM: Full    Dental  (+) Poor Dentition, Missing   Pulmonary shortness of breath, asthma , sleep apnea and Continuous Positive Airway Pressure Ventilation , Recent URI  (symptomatic Covid 11/01/21; mild congestion remains),    Pulmonary exam normal breath sounds clear to auscultation       Cardiovascular hypertension, Pt. on medications and Pt. on home beta blockers Normal cardiovascular exam Rhythm:Regular Rate:Normal  Sinus rhythm with 1st degree A-V block Low voltage QRS Cannot rule out Anteroseptal infarct , age undetermined Abnormal ECG When compared with ECG of 01-Nov-2012 13:40, PREVIOUS ECG IS PRESENT   Neuro/Psych  Headaches, negative psych ROS   GI/Hepatic negative GI ROS, Neg liver ROS,   Endo/Other  Morbid obesityK+ 5.8  Renal/GU negative Renal ROS  negative genitourinary   Musculoskeletal negative musculoskeletal ROS (+)   Abdominal   Peds negative pediatric ROS (+)  Hematology negative hematology ROS (+)   Anesthesia Other Findings   Reproductive/Obstetrics negative OB ROS                           Anesthesia Physical Anesthesia Plan  ASA: 3  Anesthesia Plan: Regional and MAC   Post-op Pain Management: Tylenol PO (pre-op)   Induction: Intravenous  PONV Risk Score and Plan: 1 and Propofol infusion, Midazolam, Treatment may vary due to age or medical condition and Ondansetron  Airway Management Planned: Natural Airway and Simple Face Mask  Additional Equipment: None  Intra-op Plan:   Post-operative Plan:   Informed Consent: I have reviewed the patients History and Physical, chart, labs and discussed the procedure including the  risks, benefits and alternatives for the proposed anesthesia with the patient or authorized representative who has indicated his/her understanding and acceptance.     Dental advisory given  Plan Discussed with: CRNA, Anesthesiologist and Surgeon  Anesthesia Plan Comments: (Supraclavicular block for postop pain control. Propofol gtt. GA/LMA as backup plan.  Recent Covid infection with significantly increased usage of his albuterol inhaler could be the cause of his hyperkalemia. If his K+ is 5.5 or below, we will proceed. If it is >5.5, he will be rescheduled for a date further out. Patient is aware and understands. Norton Blizzard, MD  )      Anesthesia Quick Evaluation

## 2021-11-23 ENCOUNTER — Encounter (HOSPITAL_BASED_OUTPATIENT_CLINIC_OR_DEPARTMENT_OTHER): Payer: Self-pay | Admitting: Orthopedic Surgery

## 2021-11-23 ENCOUNTER — Ambulatory Visit (HOSPITAL_BASED_OUTPATIENT_CLINIC_OR_DEPARTMENT_OTHER): Payer: 59 | Admitting: Anesthesiology

## 2021-11-23 ENCOUNTER — Other Ambulatory Visit: Payer: Self-pay

## 2021-11-23 ENCOUNTER — Encounter (HOSPITAL_BASED_OUTPATIENT_CLINIC_OR_DEPARTMENT_OTHER)
Admission: RE | Disposition: A | Discharge status: Home / Self Care | Payer: Self-pay | Source: Home / Self Care | Attending: Orthopedic Surgery

## 2021-11-23 ENCOUNTER — Ambulatory Visit (HOSPITAL_BASED_OUTPATIENT_CLINIC_OR_DEPARTMENT_OTHER): Payer: 59

## 2021-11-23 ENCOUNTER — Ambulatory Visit (HOSPITAL_BASED_OUTPATIENT_CLINIC_OR_DEPARTMENT_OTHER)
Admission: RE | Admit: 2021-11-23 | Discharge: 2021-11-23 | Disposition: A | Discharge status: Home / Self Care | Payer: 59 | Attending: Orthopedic Surgery | Admitting: Orthopedic Surgery

## 2021-11-23 ENCOUNTER — Other Ambulatory Visit (HOSPITAL_COMMUNITY): Payer: Self-pay

## 2021-11-23 DIAGNOSIS — J45909 Unspecified asthma, uncomplicated: Secondary | ICD-10-CM | POA: Insufficient documentation

## 2021-11-23 DIAGNOSIS — G4730 Sleep apnea, unspecified: Secondary | ICD-10-CM | POA: Diagnosis not present

## 2021-11-23 DIAGNOSIS — S6991XA Unspecified injury of right wrist, hand and finger(s), initial encounter: Secondary | ICD-10-CM

## 2021-11-23 DIAGNOSIS — X58XXXA Exposure to other specified factors, initial encounter: Secondary | ICD-10-CM | POA: Insufficient documentation

## 2021-11-23 DIAGNOSIS — Z9989 Dependence on other enabling machines and devices: Secondary | ICD-10-CM | POA: Diagnosis not present

## 2021-11-23 DIAGNOSIS — Z79899 Other long term (current) drug therapy: Secondary | ICD-10-CM | POA: Diagnosis not present

## 2021-11-23 DIAGNOSIS — I10 Essential (primary) hypertension: Secondary | ICD-10-CM | POA: Insufficient documentation

## 2021-11-23 DIAGNOSIS — S63392A Traumatic rupture of other ligament of left wrist, initial encounter: Secondary | ICD-10-CM | POA: Insufficient documentation

## 2021-11-23 DIAGNOSIS — Z8616 Personal history of COVID-19: Secondary | ICD-10-CM | POA: Diagnosis not present

## 2021-11-23 DIAGNOSIS — M25331 Other instability, right wrist: Secondary | ICD-10-CM | POA: Diagnosis not present

## 2021-11-23 DIAGNOSIS — G4733 Obstructive sleep apnea (adult) (pediatric): Secondary | ICD-10-CM | POA: Diagnosis not present

## 2021-11-23 HISTORY — DX: Presence of spectacles and contact lenses: Z97.3

## 2021-11-23 HISTORY — DX: Unspecified asthma, uncomplicated: J45.909

## 2021-11-23 HISTORY — PX: WRIST ARTHROSCOPY: SHX838

## 2021-11-23 HISTORY — DX: Dyspnea, unspecified: R06.00

## 2021-11-23 HISTORY — DX: Headache, unspecified: R51.9

## 2021-11-23 LAB — POCT I-STAT, CHEM 8
BUN: 22 mg/dL (ref 8–23)
Calcium, Ion: 1.25 mmol/L (ref 1.15–1.40)
Chloride: 103 mmol/L (ref 98–111)
Creatinine, Ser: 0.8 mg/dL (ref 0.61–1.24)
Glucose, Bld: 108 mg/dL — ABNORMAL HIGH (ref 70–99)
HCT: 50 % (ref 39.0–52.0)
Hemoglobin: 17 g/dL (ref 13.0–17.0)
Potassium: 5.8 mmol/L — ABNORMAL HIGH (ref 3.5–5.1)
Sodium: 140 mmol/L (ref 135–145)
TCO2: 31 mmol/L (ref 22–32)

## 2021-11-23 LAB — BASIC METABOLIC PANEL
Anion gap: 8 (ref 5–15)
BUN: 17 mg/dL (ref 8–23)
CO2: 28 mmol/L (ref 22–32)
Calcium: 9.3 mg/dL (ref 8.9–10.3)
Chloride: 103 mmol/L (ref 98–111)
Creatinine, Ser: 0.79 mg/dL (ref 0.61–1.24)
GFR, Estimated: >60 mL/min (ref >60)
Glucose, Bld: 109 mg/dL — ABNORMAL HIGH (ref 70–99)
Potassium: 3.7 mmol/L (ref 3.5–5.1)
Sodium: 139 mmol/L (ref 135–145)

## 2021-11-23 SURGERY — ARTHROSCOPY, WRIST
Anesthesia: Monitor Anesthesia Care | Site: Wrist | Laterality: Right

## 2021-11-23 MED ORDER — FENTANYL CITRATE (PF) 100 MCG/2ML IJ SOLN
INTRAMUSCULAR | Status: AC
  Filled 2021-11-23: qty 2

## 2021-11-23 MED ORDER — PROPOFOL 1000 MG/100ML IV EMUL
INTRAVENOUS | Status: AC
  Filled 2021-11-23: qty 100

## 2021-11-23 MED ORDER — LIDOCAINE 2% (20 MG/ML) 5 ML SYRINGE
INTRAMUSCULAR | Status: AC
  Filled 2021-11-23: qty 5

## 2021-11-23 MED ORDER — DEXAMETHASONE SODIUM PHOSPHATE 10 MG/ML IJ SOLN
INTRAMUSCULAR | Status: AC
  Filled 2021-11-23: qty 1

## 2021-11-23 MED ORDER — LIDOCAINE 2% (20 MG/ML) 5 ML SYRINGE
INTRAMUSCULAR | Status: DC | PRN
  Administered 2021-11-23: 80 mg via INTRAVENOUS

## 2021-11-23 MED ORDER — FENTANYL CITRATE (PF) 100 MCG/2ML IJ SOLN: 25.0000 ug | INTRAMUSCULAR | Status: DC | PRN

## 2021-11-23 MED ORDER — ONDANSETRON HCL 4 MG/2ML IJ SOLN
INTRAMUSCULAR | Status: AC
  Filled 2021-11-23: qty 2

## 2021-11-23 MED ORDER — OXYCODONE HCL 5 MG PO TABS: 5.0000 mg | ORAL_TABLET | Freq: Once | ORAL | Status: DC | PRN

## 2021-11-23 MED ORDER — CEFAZOLIN IN SODIUM CHLORIDE 3-0.9 GM/100ML-% IV SOLN
3.0000 g | INTRAVENOUS | Status: AC
  Administered 2021-11-23: 3 g via INTRAVENOUS

## 2021-11-23 MED ORDER — OXYCODONE HCL 5 MG/5ML PO SOLN: 5.0000 mg | Freq: Once | ORAL | Status: DC | PRN

## 2021-11-23 MED ORDER — MIDAZOLAM HCL 2 MG/2ML IJ SOLN
1.0000 mg | Freq: Once | INTRAMUSCULAR | Status: AC
  Administered 2021-11-23: 1 mg via INTRAVENOUS

## 2021-11-23 MED ORDER — ONDANSETRON HCL 4 MG/2ML IJ SOLN
INTRAMUSCULAR | Status: DC | PRN
  Administered 2021-11-23: 4 mg via INTRAVENOUS

## 2021-11-23 MED ORDER — OXYCODONE-ACETAMINOPHEN 5-325 MG PO TABS
1.0000 | ORAL_TABLET | Freq: Four times a day (QID) | ORAL | 0 refills | Status: AC | PRN
  Filled 2021-11-23: qty 20, 5d supply, fill #0

## 2021-11-23 MED ORDER — PROPOFOL 10 MG/ML IV BOLUS
INTRAVENOUS | Status: DC | PRN
  Administered 2021-11-23 (×2): 10 mg via INTRAVENOUS
  Administered 2021-11-23: 20 mg via INTRAVENOUS

## 2021-11-23 MED ORDER — MIDAZOLAM HCL 2 MG/2ML IJ SOLN
INTRAMUSCULAR | Status: AC
  Filled 2021-11-23: qty 2

## 2021-11-23 MED ORDER — CEFAZOLIN IN SODIUM CHLORIDE 3-0.9 GM/100ML-% IV SOLN
INTRAVENOUS | Status: AC
  Filled 2021-11-23: qty 100

## 2021-11-23 MED ORDER — DEXAMETHASONE SODIUM PHOSPHATE 10 MG/ML IJ SOLN
INTRAMUSCULAR | Status: DC | PRN
  Administered 2021-11-23: 8 mg via INTRAVENOUS

## 2021-11-23 MED ORDER — ONDANSETRON HCL 4 MG/2ML IJ SOLN: 4.0000 mg | Freq: Once | INTRAMUSCULAR | Status: DC | PRN

## 2021-11-23 MED ORDER — ROPIVACAINE HCL 5 MG/ML IJ SOLN
INTRAMUSCULAR | Status: DC | PRN
  Administered 2021-11-23: 30 mL via PERINEURAL

## 2021-11-23 MED ORDER — PROPOFOL 500 MG/50ML IV EMUL
INTRAVENOUS | Status: DC | PRN
  Administered 2021-11-23: 125 ug/kg/min via INTRAVENOUS

## 2021-11-23 MED ORDER — DROPERIDOL 2.5 MG/ML IJ SOLN: 0.6250 mg | Freq: Once | INTRAMUSCULAR | Status: DC | PRN

## 2021-11-23 MED ORDER — ACETAMINOPHEN 500 MG PO TABS
ORAL_TABLET | ORAL | Status: AC
  Filled 2021-11-23: qty 2

## 2021-11-23 MED ORDER — LACTATED RINGERS IV SOLN: INTRAVENOUS | Status: DC

## 2021-11-23 MED ORDER — ACETAMINOPHEN 500 MG PO TABS
1000.0000 mg | ORAL_TABLET | Freq: Once | ORAL | Status: AC
  Administered 2021-11-23: 1000 mg via ORAL

## 2021-11-23 MED ORDER — FENTANYL CITRATE (PF) 100 MCG/2ML IJ SOLN
50.0000 ug | Freq: Once | INTRAMUSCULAR | Status: AC
  Administered 2021-11-23: 50 ug via INTRAVENOUS

## 2021-11-23 SURGICAL SUPPLY — 51 items
ADH SKN CLS APL DERMABOND .7 (GAUZE/BANDAGES/DRESSINGS) ×1
ANCH SUT SWVLOCK 8.5 (Anchor) ×1 IMPLANT
ANCHOR DX SWIVELOCK SL 3.5X8.5 (Anchor) ×1 IMPLANT
ANCHOR REPAIR HAND WRIST (Orthopedic Implant) ×2 IMPLANT
BLADE SURG 15 STRL LF DISP TIS (BLADE) ×2 IMPLANT
BLADE SURG 15 STRL SS (BLADE) ×4
BNDG CMPR 9X4 STRL LF SNTH (GAUZE/BANDAGES/DRESSINGS) ×1
BNDG ELASTIC 4X5.8 VLCR STR LF (GAUZE/BANDAGES/DRESSINGS) ×2 IMPLANT
BNDG ESMARK 4X9 LF (GAUZE/BANDAGES/DRESSINGS) ×2 IMPLANT
CORD BIPOLAR FORCEPS 12FT (ELECTRODE) ×2 IMPLANT
COVER BACK TABLE 60X90IN (DRAPES) ×2 IMPLANT
CUFF TOURN SGL QUICK 18X4 (TOURNIQUET CUFF) ×2 IMPLANT
DECANTER SPIKE VIAL GLASS SM (MISCELLANEOUS) IMPLANT
DERMABOND ADVANCED (GAUZE/BANDAGES/DRESSINGS) ×1
DERMABOND ADVANCED .7 DNX12 (GAUZE/BANDAGES/DRESSINGS) ×1 IMPLANT
DRAPE EXTREMITY T 121X128X90 (DISPOSABLE) ×2 IMPLANT
DRAPE OEC MINIVIEW 54X84 (DRAPES) ×2 IMPLANT
DRAPE SHEET LG 3/4 BI-LAMINATE (DRAPES) ×2 IMPLANT
DRAPE SURG 17X23 STRL (DRAPES) ×2 IMPLANT
ELECT REM PT RETURN 9FT ADLT (ELECTROSURGICAL) ×2
ELECTRODE REM PT RTRN 9FT ADLT (ELECTROSURGICAL) ×1 IMPLANT
GAUZE 4X4 16PLY ~~LOC~~+RFID DBL (SPONGE) ×2 IMPLANT
GAUZE SPONGE 4X4 12PLY STRL (GAUZE/BANDAGES/DRESSINGS) ×2 IMPLANT
GAUZE XEROFORM 1X8 LF (GAUZE/BANDAGES/DRESSINGS) ×1 IMPLANT
GLOVE SURG UNDER POLY LF SZ7.5 (GLOVE) ×3 IMPLANT
GOWN STRL REUS W/TWL LRG LVL3 (GOWN DISPOSABLE) ×2 IMPLANT
K-WIRE .045X4 (WIRE) ×3 IMPLANT
K-WIRE SURGICAL 1.6X102 (WIRE) ×2 IMPLANT
K-Wire 1.6mm (0.62") x 102mm (4") ×1 IMPLANT
NDL HYPO 25X1 1.5 SAFETY (NEEDLE) ×1 IMPLANT
NEEDLE HYPO 25X1 1.5 SAFETY (NEEDLE) ×2 IMPLANT
NS IRRIG 1000ML POUR BTL (IV SOLUTION) ×2 IMPLANT
NS IRRIG 500ML POUR BTL (IV SOLUTION) ×1 IMPLANT
PACK BASIN DAY SURGERY FS (CUSTOM PROCEDURE TRAY) ×2 IMPLANT
PAD CAST 4YDX4 CTTN HI CHSV (CAST SUPPLIES) ×1 IMPLANT
PADDING CAST COTTON 4X4 STRL (CAST SUPPLIES) ×2
SLING ARM FOAM STRAP XLG (SOFTGOODS) ×1 IMPLANT
SLING ARM IMMOBILIZER XL (CAST SUPPLIES) ×1 IMPLANT
STRIP CLOSURE SKIN 1/2X4 (GAUZE/BANDAGES/DRESSINGS) ×2 IMPLANT
SUCTION FRAZIER HANDLE 10FR (MISCELLANEOUS) ×2
SUCTION TUBE FRAZIER 10FR DISP (MISCELLANEOUS) ×1 IMPLANT
SUT ETHILON 4 0 PS 2 18 (SUTURE) ×1 IMPLANT
SUT MNCRL AB 4-0 PS2 18 (SUTURE) ×2 IMPLANT
SUT VIC AB 3-0 PS2 18 (SUTURE) ×2
SUT VIC AB 3-0 PS2 18XBRD (SUTURE) IMPLANT
SUT VIC AB 3-0 SH 27 (SUTURE) ×2
SUT VIC AB 3-0 SH 27X BRD (SUTURE) IMPLANT
SYR 10ML LL (SYRINGE) ×2 IMPLANT
SYR BULB EAR ULCER 2OZ BL STRL (SYRINGE) ×2 IMPLANT
TOWEL OR 17X26 10 PK STRL BLUE (TOWEL DISPOSABLE) ×2 IMPLANT
TRAY DSU PREP LF (CUSTOM PROCEDURE TRAY) ×2 IMPLANT

## 2021-11-23 NOTE — Op Note (Addendum)
OPERATIVE NOTE  DATE OF PROCEDURE: 11/23/21  SURGEONS:  Primary: Orene Desanctis, MD  PREOPERATIVE DIAGNOSIS: RIGHT scapholunate ligament rupture  POSTOPERATIVE DIAGNOSIS: Same  NAME OF PROCEDURE:   RIGHT scapholunate ligament repair with internal brace RIGHT extensor pollicis longus transposition (38756) RIGHT posterior interosseous nerve neurectomy (43329) RIGHT wrist intraoperative radiographs four view with interpretation  ANESTHESIA: Regional  SKIN PREPARATION: Hibiclens  ESTIMATED BLOOD LOSS: Minimal  IMPLANTS: Arthrex all-dorsal internal brace Implant Name Type Inv. Item Serial No. Manufacturer Lot No. LRB No. Used Action  KIT HAND WRIST LIGAMENT AUG - [id number] Orthopedic Implant KIT HAND WRIST LIGAMENT AUG  [facility] [id number] Right 2 Implanted  ANCHOR DX SWIVELOCK SL 3.5X8.5 - [id number] Anchor ANCHOR DX SWIVELOCK SL 3.5X8.5  [employer] [id number] Right 1 Implanted  K-Wire 1.7m (0.62") x 1060m(4")   [id number]  [id number] 1 Implanted   INDICATIONS:  LeStaces a [age].o. male who has the above preoperative diagnosis. The patient has decided to proceed with surgical intervention.  Risks, benefits and alternatives of operative management were discussed including, but not limited to, risks of anesthesia complications, infection, pain, persistent symptoms, stiffness, need for future surgery.  The patient understands, agrees and elects to proceed with surgery.    DESCRIPTION OF PROCEDURE: The patient was met in the pre-operative area and their identity was verified.  The operative location and laterality was also verified and marked.  The patient was brought to the OR and was placed supine on the table.  After repeat patient identification with the operative team anesthesia was provided and the patient was prepped and draped in the usual sterile fashion.  A final timeout was performed verifying the correction patient, procedure, location and laterality.  The RIGHT upper  extremity was elevated and tourniquet was inflated. A longitudinal incision was made over the wrist. Skin and subcutaneous tissues were divided and hemostasis was obtained. The extensor pollicis longus tendon was identified, the sheath was release and an extensor pollicis longus tendon transposition was performed, transferring the tendon to a superficial and radial position. At this time attention was turned to identification of the posterior interosseous nerve at the floor of the fourth extensor compartment. This was isolated and a neurectomy was performed by removing a 2 cm segment of the nerve and maintaining hemostasis. Then the interval between the second and fourth extensor compartments was identified and inverted T shaped capsulotomy was performed. The scapholunate ligament interval was identified and the SL ligament was avulsed off the lunate. There was significant cartilage loss off the scaphoid at the radioscaphoid articulation. Joystick K-wires were placed and the SL was reduced out of DISI. The k wires for the Arthrex internal brace SL repair were placed in the proximal scaphoid, lunate and distal scaphoid. The reduction of the SL diastasis and scaphoid flexion was performed. The K-wires were overdrilled and the suture tape anchors were placed at all three locations with excellent purchase.  A scaphocapitate K-wire was placed via mini-open approach to protect the neurovascular structure. C-arm fluoroscopy of the wrist four views confirmed excellent reduction of the scapholunate ligament interval and placement of the hardware. The wound was irrigated and then attention turned to closure. 3-0 vicryl was used for capsular closure. The extensor retinaculum was then repaired leaving the EPL superficial and radial via transposition. Hemostasis was obtained and then the skin was closed with 4-0 nylon horizontal mattress suture technique. A sterile soft dressing was applied followed by a short arm volar plaster  splint. The tourniquet  was deflated and the fingers turned pink and warm and well-perfused. All counts were correct x2. The patient was brought to recovery in stable condition.    Matt Holmes, MD

## 2021-11-23 NOTE — Progress Notes (Signed)
AssistedDr. Bass with right, ultrasound guided, interscalene  block. Side rails up, monitors on throughout procedure. See vital signs in flow sheet. Tolerated Procedure well.  

## 2021-11-23 NOTE — Anesthesia Postprocedure Evaluation (Signed)
Anesthesia Post Note  Patient: Jake Martin  Procedure(s) Performed: right wrist scapholunate ligament repair possible reconstruction (Right: Wrist)     Patient location during evaluation: PACU Anesthesia Type: Regional and MAC Level of consciousness: awake and alert Pain management: pain level controlled Vital Signs Assessment: post-procedure vital signs reviewed and stable Respiratory status: spontaneous breathing and respiratory function stable Cardiovascular status: stable Postop Assessment: no apparent nausea or vomiting Anesthetic complications: no   No notable events documented.  Last Vitals:  Vitals:   11/23/21 0740 11/23/21 0930  BP: (!) 149/98 133/90  Pulse: (!) 55 61  Resp: 16 12  Temp:  (!) 36.4 C  SpO2: 95% 90%    Last Pain:  Vitals:   11/23/21 0559  TempSrc: Oral  PainSc: 0-No pain                 Merlinda Frederick

## 2021-11-23 NOTE — Discharge Instructions (Addendum)
Orthopaedic Hand Surgery Discharge Instructions  WEIGHT BEARING STATUS: Non weight bearing on operative extremity  DRESSING CARE: Please keep your dressing/splint/cast clean and dry until your follow-up appointment. You may shower by placing a waterproof covering over your dressing/splint/cast. Contact your surgeon if your splint/cast gets wet. It will need to be changed to prevent skin breakdown.  PAIN CONTROL: First line medications for post operative pain control are Tylenol (acetaminophen) and Motrin (ibuprofen) if you are able to take these medications. If you have been prescribed a medication these can be taken as breakthrough pain medications. Please note that some narcotic pain medication has acetaminophen added and you should never consume more than 4,000mg of acetaminophen in 24-hour period. Please note that if you are given Toradol (ketorolac) you should not take similar medications such as ibuprofen or naproxen.  DISCHARGE MEDICATIONS: If you have been prescribed medication it was sent electronically to your pharmacy. No changes have been made to your home medications.  ICE/ELEVATION: Ice and elevate your injured extremity as needed. Avoid direct contact of ice with skin.   BANDAGE FEELS TOO TIGHT: If your bandage feels too tight, first make sure you are elevating your fingers as much as possible. The outer layer of the bandage can be unwrapped and reapplied more loosely. If no improvement, you may carefully cut the inner layer longitudinally until the pressure has resolved and then rewrap the outer layer. If you are not comfortable with these instructions, please call the office and the bandage can be changed for you.   FOLLOW UP: You will be called after surgery with an appointment date and time, however if you have not received a phone call within 3 days, please call during regular office hours at [phone] to schedule a post operative appointment.  Please Seek Medical Attention  if: Call MD for: pain or pressure in chest, jaw, arm, back, neck  Call MD for: temperature greater than 101 F for more than 24 hrs Call MD for: difficulty breathing Call MD for: incision redness, bleeding, drainage  Call MD for: palpitations or feeling that the heart is racing  Call MD for: increased swelling in arm, leg, ankle, or abdomen  Call MD for: lightheadedness, dizziness, fainting Call 911 or go to ER for any medical emergency if you are not able to get in touch with your doctor   J. Reid Spears, MD Orthopaedic Hand Surgeon [facility] Office number: [phone] [address] [place], [place] [address]  Regional Anesthesia Blocks  1. Numbness or the inability to move the "blocked" extremity may last from 3-48 hours after placement. The length of time depends on the medication injected and your individual response to the medication. If the numbness is not going away after 48 hours, call your surgeon.  2. The extremity that is blocked will need to be protected until the numbness is gone and the  Strength has returned. Because you cannot feel it, you will need to take extra care to avoid injury. Because it may be weak, you may have difficulty moving it or using it. You may not know what position it is in without looking at it while the block is in effect.  3. For blocks in the legs and feet, returning to weight bearing and walking needs to be done carefully. You will need to wait until the numbness is entirely gone and the strength has returned. You should be able to move your leg and foot normally before you try and bear weight or walk.   You will need someone to be with you when you first try to ensure you do not fall and possibly risk injury.  4. Bruising and tenderness at the needle site are common side effects and will resolve in a few days.  5. Persistent numbness or new problems with movement should be communicated to the surgeon or the [facility]  ([phone])/ [facility] ([phone]).  Post Anesthesia Home Care Instructions  Activity: Get plenty of rest for the remainder of the day. A responsible individual must stay with you for 24 hours following the procedure.  For the next 24 hours, DO NOT: -Drive a car -Operate machinery -Drink alcoholic beverages -Take any medication unless instructed by your physician -Make any legal decisions or sign important papers.  Meals: Start with liquid foods such as gelatin or soup. Progress to regular foods as tolerated. Avoid greasy, spicy, heavy foods. If nausea and/or vomiting occur, drink only clear liquids until the nausea and/or vomiting subsides. Call your physician if vomiting continues.  Special Instructions/Symptoms: Your throat may feel dry or sore from the anesthesia or the breathing tube placed in your throat during surgery. If this causes discomfort, gargle with warm salt water. The discomfort should disappear within 24 hours.   

## 2021-11-23 NOTE — Anesthesia Procedure Notes (Signed)
Anesthesia Regional Block: Supraclavicular block   Pre-Anesthetic Checklist: , timeout performed,  Correct Patient, Correct Site, Correct Laterality,  Correct Procedure, Correct Position, site marked,  Risks and benefits discussed,  Surgical consent,  Pre-op evaluation,  At surgeon's request and post-op pain management  Laterality: Right  Prep: chloraprep       Needles:  Injection technique: Single-shot  Needle Type: Echogenic Stimulator Needle     Needle Length: 10cm  Needle Gauge: 20     Additional Needles:   Procedures:,,,, ultrasound used (permanent image in chart),,    Narrative:  Start time: 11/23/2021 7:25 AM End time: 11/23/2021 7:30 AM Injection made incrementally with aspirations every 5 mL.  Performed by: Personally  Anesthesiologist: Mellody Dance, MD

## 2021-11-23 NOTE — Interval H&P Note (Signed)
History and Physical Interval Note:  11/23/2021 7:32 AM  Jake Martin  has presented today for surgery, with the diagnosis of right wrist scapholunate ligament rupture.  The various methods of treatment have been discussed with the patient and family. After consideration of risks, benefits and other options for treatment, the patient has consented to  Procedure(s): right wrist scapholunate ligament repair possible reconstruction (Right) as a surgical intervention.  The patient's history has been reviewed, patient examined, no change in status, stable for surgery.  I have reviewed the patient's chart and labs.  Questions were answered to the patient's satisfaction.     [name]

## 2021-11-23 NOTE — Transfer of Care (Signed)
Immediate Anesthesia Transfer of Care Note  Patient: Jake Martin  Procedure(s) Performed: right wrist scapholunate ligament repair possible reconstruction (Right: Wrist)  Patient Location: PACU and Short Stay  Anesthesia Type:MAC  Level of Consciousness: awake, alert  and oriented  Airway & Oxygen Therapy: Patient Spontanous Breathing  Post-op Assessment: Report given to RN and Post -op Vital signs reviewed and stable  Post vital signs: Reviewed and stable  Last Vitals:  Vitals Value Taken Time  BP    Temp    Pulse    Resp    SpO2      Last Pain:  Vitals:   11/23/21 0559  TempSrc: Oral  PainSc: 0-No pain      Patients Stated Pain Goal: 5 (47/18/55 0158)  Complications: No notable events documented.

## 2021-11-24 ENCOUNTER — Encounter (HOSPITAL_BASED_OUTPATIENT_CLINIC_OR_DEPARTMENT_OTHER): Payer: Self-pay | Admitting: Orthopedic Surgery

## 2021-12-08 DIAGNOSIS — S63391A Traumatic rupture of other ligament of right wrist, initial encounter: Secondary | ICD-10-CM | POA: Diagnosis not present

## 2021-12-27 DIAGNOSIS — G4733 Obstructive sleep apnea (adult) (pediatric): Secondary | ICD-10-CM | POA: Diagnosis not present

## 2022-01-05 ENCOUNTER — Other Ambulatory Visit: Payer: Self-pay

## 2022-01-05 DIAGNOSIS — S63391A Traumatic rupture of other ligament of right wrist, initial encounter: Secondary | ICD-10-CM | POA: Diagnosis not present

## 2022-01-12 DIAGNOSIS — M25531 Pain in right wrist: Secondary | ICD-10-CM | POA: Diagnosis not present

## 2022-01-12 DIAGNOSIS — S63391D Traumatic rupture of other ligament of right wrist, subsequent encounter: Secondary | ICD-10-CM | POA: Diagnosis not present

## 2022-01-19 DIAGNOSIS — M25531 Pain in right wrist: Secondary | ICD-10-CM | POA: Diagnosis not present

## 2022-01-24 DIAGNOSIS — M25531 Pain in right wrist: Secondary | ICD-10-CM | POA: Diagnosis not present

## 2022-02-02 DIAGNOSIS — M25531 Pain in right wrist: Secondary | ICD-10-CM | POA: Diagnosis not present

## 2022-02-09 DIAGNOSIS — M25531 Pain in right wrist: Secondary | ICD-10-CM | POA: Diagnosis not present

## 2022-02-11 ENCOUNTER — Other Ambulatory Visit (HOSPITAL_COMMUNITY): Payer: Self-pay

## 2022-02-12 ENCOUNTER — Other Ambulatory Visit (HOSPITAL_COMMUNITY): Payer: Self-pay

## 2022-02-16 DIAGNOSIS — M25531 Pain in right wrist: Secondary | ICD-10-CM | POA: Diagnosis not present

## 2022-02-19 ENCOUNTER — Other Ambulatory Visit (HOSPITAL_COMMUNITY): Payer: Self-pay

## 2022-03-07 DIAGNOSIS — S63391A Traumatic rupture of other ligament of right wrist, initial encounter: Secondary | ICD-10-CM | POA: Diagnosis not present

## 2022-03-07 DIAGNOSIS — M25531 Pain in right wrist: Secondary | ICD-10-CM | POA: Diagnosis not present

## 2022-03-28 DIAGNOSIS — G4733 Obstructive sleep apnea (adult) (pediatric): Secondary | ICD-10-CM | POA: Diagnosis not present

## 2022-04-17 ENCOUNTER — Ambulatory Visit: Payer: 59 | Admitting: Adult Health

## 2022-04-17 ENCOUNTER — Encounter: Payer: Self-pay | Admitting: Adult Health

## 2022-04-17 VITALS — BP 137/79 | HR 70 | Ht 72.0 in | Wt 305.2 lb

## 2022-04-17 DIAGNOSIS — Z9989 Dependence on other enabling machines and devices: Secondary | ICD-10-CM | POA: Diagnosis not present

## 2022-04-17 DIAGNOSIS — G4733 Obstructive sleep apnea (adult) (pediatric): Secondary | ICD-10-CM | POA: Diagnosis not present

## 2022-04-17 NOTE — Patient Instructions (Signed)

## 2022-04-17 NOTE — Progress Notes (Signed)
PATIENT: Jake Martin DOB: 1961-08-08  REASON FOR VISIT: follow up HISTORY FROM: patient  Chief Complaint  Patient presents with   Follow-up    Pt in 19  Pt is here for CPAP follow up      HISTORY OF PRESENT ILLNESS: Today 04/17/22:  Mr. Jake Martin is a 61 year old male with a history of OSA on CPAP. He returns today for follow-up. Denies any new issues with the CPAP. Reports that he "gets the best sleep in years."    04/12/21: Mr.Jake Martin is a 61 year old male with a history of obstructive sleep apnea on CPAP.  He did not bring his machine with him today we were unable to obtain a download.  We will reach out to his DME company.  He reports that he does use a CPAP nightly.  He denies any issues with the CPAP.  He returns today for an evaluation.  04/08/20: Mr. Jake Martin is a 61 year old male with a history of obstructive sleep apnea on CPAP.  His download indicates that he use his machine nightly for compliance of 100%.  He uses machine greater than 4 hours each night.  On average he uses his machine 6 hours and 33 minutes.  His residual AHI is 1.8 on 10 cm of water with an EPR of 1.  Leak in the 95th percentile is 6.4 L/min.  Reports that the CPAP is working well for him.  He returns today for follow-up  HISTORY 01/12/20: I was not able to review his latest compliance data. We could not access his data remotely. He did not bring his machine or SD card from his machine.  He reports poor compliance with his machine, he uses a full facemask.  He needs new supplies but would like to wait until he gets his new machine, he should be eligible for new equipment at this point.  He is still debating whether to take the Covid vaccine.  His wife got vaccinated and his daughter got vaccinated as well.  Both work in the healthcare environment.  He got started on metoprolol for additional treatment of his blood pressure.   REVIEW OF SYSTEMS: Out of a complete 14 system review of symptoms, the  patient complains only of the following symptoms, and all other reviewed systems are negative.  ESS 5   ALLERGIES: Allergies  Allergen Reactions   Dust Mite Extract    Lisinopril Swelling    Lips and eyes swell   Molds & Smuts     HOME MEDICATIONS: Outpatient Medications Prior to Visit  Medication Sig Dispense Refill   albuterol (VENTOLIN HFA) 108 (90 Base) MCG/ACT inhaler Inhale 2 puffs by mouth every 4 hours as needed (Patient taking differently: Inhale 1 puff into the lungs as needed.) 18 g 11   Albuterol Sulfate (PROAIR RESPICLICK) 108 (90 Base) MCG/ACT AEPB Inhale 1 puff by mouth every 4 hours as needed (Patient taking differently: as needed. Hasnt filled yet 11/16/2021) 1 each 2   amLODipine (NORVASC) 10 MG tablet TAKE 1 TABLET BY MOUTH DAILY (Patient taking differently: Take 10 mg by mouth daily. At bedtime) 90 tablet 2   Ascorbic Acid (VITAMIN C) 1000 MG tablet Take 1,000 mg by mouth daily.     Aspirin-Acetaminophen 500-325 MG PACK Take 1 packet by mouth every 6 (six) hours as needed (headaches).     budesonide-formoterol (SYMBICORT) 160-4.5 MCG/ACT inhaler INHALE TWO PUFFS BY MOUTH INTO THE LUNGS TWICE DAILY (Patient taking differently: Inhale 2 puffs into the  lungs as needed.) 10.2 g 6   hydrochlorothiazide (HYDRODIURIL) 25 MG tablet Take 25 mg by mouth daily. At bedtime     hydrochlorothiazide (HYDRODIURIL) 25 MG tablet TAKE 1 TABLET BY MOUTH EVERY MORNING FOR BLOOD PRESSURE 90 tablet 2   metoprolol succinate (TOPROL XL) 50 MG 24 hr tablet Take one tablet by mouth daily at bedtime. (Patient taking differently: Take by mouth. Not taking due to symptoms) 90 tablet 3   molnupiravir EUA (LAGEVRIO) 200 MG CAPS capsule Take 4 capsules by mouth every 12 hours for 5 days. (Patient taking differently: Take by mouth. Completed dose) 40 capsule 0   Multiple Vitamins-Minerals (MULTIVITAMIN WITH MINERALS) tablet Take 1 tablet by mouth daily.     SYMBICORT 160-4.5 MCG/ACT inhaler Inhale 2  puffs into the lungs in the morning and at bedtime.     No facility-administered medications prior to visit.    PAST MEDICAL HISTORY: Past Medical History:  Diagnosis Date   Asthma    Last asthma attack about a year ago 11/16/2021   COVID 11/01/2021   cough and congestion, current cough 11/16/2022   Dyspnea    Headache    Hernia    umbilical hernia   Hyperlipidemia    Hypertension    Obesity    OSA on CPAP    Pneumonia    Wears glasses     PAST SURGICAL HISTORY: Past Surgical History:  Procedure Laterality Date   APPENDECTOMY  2005   EYE SURGERY  1967-1968   [facility]il   HERNIA REPAIR  2004   LAPAROSCOPIC ASSISTED VENTRAL HERNIA REPAIR Bilateral 01/07/2021   Procedure: diagnostic laparoscopy open repair recurrent incarcerated ventral hernia;  Surgeon: Gaynelle Adu, MD;  Location: [facility];  Service: General;  Laterality: Bilateral;   WISDOM TOOTH EXTRACTION     WRIST ARTHROSCOPY Right 11/23/2021   Procedure: right wrist scapholunate ligament repair possible reconstruction;  Surgeon: [name], MD;  Location: [facility];  Service: Orthopedics;  Laterality: Right;    FAMILY HISTORY: Family History  Problem Relation Age of Onset   Hypertension Sister    COPD Father    Hypertension Father    Aneurysm Mother        BRAIN   Diabetes Other    Thyroid disease Other    Diabetes type II Other        GRANDFATHER   Coronary artery disease Other        GRANDMOTHER   Diabetes Paternal Grandmother    Diabetes Son     SOCIAL HISTORY: Social History   Socioeconomic History   Marital status: Married    Spouse name: [name]   Number of children: 2   Years of education: 15   Highest education level: Not on file  Occupational History    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  Tobacco Use   Smoking status: Never   Smokeless tobacco: Current    Types: Chew   Tobacco comments:    Chews daily  Vaping Use   Vaping Use: Never used  Substance and Sexual Activity   Alcohol  use: Yes    Alcohol/week: 2.0 standard drinks    Types: 2 Standard drinks or equivalent per week    Comment: wine 3-5 glasses weekly   Drug use: No   Sexual activity: Not on file  Other Topics Concern   Not on file  Social History Narrative   Right handed and consumes 3-5 cups of caffeine daily   Social Determinants of Health  Financial Resource Strain: Not on file  Food Insecurity: Not on file  Transportation Needs: Not on file  Physical Activity: Not on file  Stress: Not on file  Social Connections: Not on file  Intimate Partner Violence: Not on file      PHYSICAL EXAM  Vitals:   04/17/22 1250  BP: 137/79  Pulse: 70  Weight: (!) 305 lb 3.2 oz (138.4 kg)  Height: 6' (1.829 m)    Body mass index is 41.39 kg/m.  Generalized: Well developed, in no acute distress  Chest: Lungs clear to auscultation bilaterally  Neurological examination  Mentation: Alert oriented to time, place, history taking. Follows all commands speech and language fluent Cranial nerve II-XII: Extraocular movements were full, visual field were full on confrontational test Head turning and shoulder shrug  were normal and symmetric. Motor: The motor testing reveals 5 over 5 strength of all 4 extremities. Good symmetric motor tone is noted throughout.  Sensory: Sensory testing is intact to soft touch on all 4 extremities. No evidence of extinction is noted.  Gait and station: Gait is normal.    DIAGNOSTIC DATA (LABS, IMAGING, TESTING) - I reviewed patient records, labs, notes, testing and imaging myself where available.     ASSESSMENT AND PLAN 61 y.o. year old male  has a past medical history of Asthma, COVID (11/01/2021), Dyspnea, Headache, Hernia, Hyperlipidemia, Hypertension, Obesity, OSA on CPAP, Pneumonia, and Wears glasses. here with:  OSA on CPAP  - Good Compliance - Good treatment of apnea - encourage to use CPAP nightly and > 4 hours each night. - FU in 1 year or sooner if  needed    Butch Penny, MSN, NP-C 04/17/2022, 12:45 PM [facility] [address] [place], [place] [address] [phone]

## 2022-04-25 ENCOUNTER — Other Ambulatory Visit (HOSPITAL_COMMUNITY): Payer: Self-pay

## 2022-05-01 ENCOUNTER — Other Ambulatory Visit (HOSPITAL_COMMUNITY): Payer: Self-pay

## 2022-05-01 DIAGNOSIS — M25561 Pain in right knee: Secondary | ICD-10-CM | POA: Diagnosis not present

## 2022-05-01 DIAGNOSIS — M1711 Unilateral primary osteoarthritis, right knee: Secondary | ICD-10-CM | POA: Diagnosis not present

## 2022-05-01 MED ORDER — PREDNISONE 5 MG (21) PO TBPK
ORAL_TABLET | ORAL | 0 refills | Status: DC
  Filled 2022-05-01: qty 21, 6d supply, fill #0

## 2022-05-11 ENCOUNTER — Other Ambulatory Visit (HOSPITAL_COMMUNITY): Payer: Self-pay

## 2022-05-11 MED ORDER — AMLODIPINE BESYLATE 10 MG PO TABS
10.0000 mg | ORAL_TABLET | Freq: Every day | ORAL | 0 refills | Status: DC
  Filled 2022-05-11: qty 90, 90d supply, fill #0

## 2022-05-11 MED ORDER — HYDROCHLOROTHIAZIDE 25 MG PO TABS
ORAL_TABLET | ORAL | 0 refills | Status: DC
  Filled 2022-05-11: qty 90, 90d supply, fill #0

## 2022-05-15 ENCOUNTER — Other Ambulatory Visit (HOSPITAL_COMMUNITY): Payer: Self-pay

## 2022-05-29 ENCOUNTER — Other Ambulatory Visit (HOSPITAL_COMMUNITY): Payer: Self-pay

## 2022-05-30 ENCOUNTER — Other Ambulatory Visit (HOSPITAL_COMMUNITY): Payer: Self-pay

## 2022-05-30 DIAGNOSIS — M25561 Pain in right knee: Secondary | ICD-10-CM | POA: Diagnosis not present

## 2022-05-30 DIAGNOSIS — M1711 Unilateral primary osteoarthritis, right knee: Secondary | ICD-10-CM | POA: Diagnosis not present

## 2022-05-30 MED ORDER — DICLOFENAC SODIUM 75 MG PO TBEC
DELAYED_RELEASE_TABLET | ORAL | 1 refills | Status: DC
  Filled 2022-05-30: qty 60, 30d supply, fill #0

## 2022-06-29 DIAGNOSIS — G4733 Obstructive sleep apnea (adult) (pediatric): Secondary | ICD-10-CM | POA: Diagnosis not present

## 2022-08-03 DIAGNOSIS — I10 Essential (primary) hypertension: Secondary | ICD-10-CM | POA: Diagnosis not present

## 2022-08-03 DIAGNOSIS — R7301 Impaired fasting glucose: Secondary | ICD-10-CM | POA: Diagnosis not present

## 2022-08-03 DIAGNOSIS — E785 Hyperlipidemia, unspecified: Secondary | ICD-10-CM | POA: Diagnosis not present

## 2022-08-03 DIAGNOSIS — Z125 Encounter for screening for malignant neoplasm of prostate: Secondary | ICD-10-CM | POA: Diagnosis not present

## 2022-08-09 ENCOUNTER — Other Ambulatory Visit (HOSPITAL_COMMUNITY): Payer: Self-pay

## 2022-08-09 DIAGNOSIS — K429 Umbilical hernia without obstruction or gangrene: Secondary | ICD-10-CM | POA: Diagnosis not present

## 2022-08-09 DIAGNOSIS — G4733 Obstructive sleep apnea (adult) (pediatric): Secondary | ICD-10-CM | POA: Diagnosis not present

## 2022-08-09 DIAGNOSIS — J309 Allergic rhinitis, unspecified: Secondary | ICD-10-CM | POA: Diagnosis not present

## 2022-08-09 DIAGNOSIS — E785 Hyperlipidemia, unspecified: Secondary | ICD-10-CM | POA: Diagnosis not present

## 2022-08-09 DIAGNOSIS — Z1331 Encounter for screening for depression: Secondary | ICD-10-CM | POA: Diagnosis not present

## 2022-08-09 DIAGNOSIS — I10 Essential (primary) hypertension: Secondary | ICD-10-CM | POA: Diagnosis not present

## 2022-08-09 DIAGNOSIS — M545 Low back pain, unspecified: Secondary | ICD-10-CM | POA: Diagnosis not present

## 2022-08-09 DIAGNOSIS — R7301 Impaired fasting glucose: Secondary | ICD-10-CM | POA: Diagnosis not present

## 2022-08-09 DIAGNOSIS — Z0000 Encounter for general adult medical examination without abnormal findings: Secondary | ICD-10-CM | POA: Diagnosis not present

## 2022-08-09 DIAGNOSIS — R82998 Other abnormal findings in urine: Secondary | ICD-10-CM | POA: Diagnosis not present

## 2022-08-09 DIAGNOSIS — Z1339 Encounter for screening examination for other mental health and behavioral disorders: Secondary | ICD-10-CM | POA: Diagnosis not present

## 2022-08-09 MED ORDER — CYCLOBENZAPRINE HCL 5 MG PO TABS
5.0000 mg | ORAL_TABLET | Freq: Three times a day (TID) | ORAL | 1 refills | Status: DC | PRN
  Filled 2022-08-09: qty 30, 10d supply, fill #0

## 2022-08-10 ENCOUNTER — Other Ambulatory Visit (HOSPITAL_COMMUNITY): Payer: Self-pay

## 2022-08-17 ENCOUNTER — Other Ambulatory Visit (HOSPITAL_COMMUNITY): Payer: Self-pay

## 2022-08-20 ENCOUNTER — Other Ambulatory Visit (HOSPITAL_COMMUNITY): Payer: Self-pay

## 2022-08-20 MED ORDER — AMLODIPINE BESYLATE 10 MG PO TABS
10.0000 mg | ORAL_TABLET | Freq: Every day | ORAL | 3 refills | Status: DC
  Filled 2022-08-20: qty 90, 90d supply, fill #0
  Filled 2022-11-19: qty 90, 90d supply, fill #1

## 2022-08-20 MED ORDER — HYDROCHLOROTHIAZIDE 25 MG PO TABS
25.0000 mg | ORAL_TABLET | Freq: Every morning | ORAL | 3 refills | Status: DC
  Filled 2022-08-20: qty 90, 90d supply, fill #0
  Filled 2022-11-19: qty 90, 90d supply, fill #1

## 2022-08-21 ENCOUNTER — Other Ambulatory Visit (HOSPITAL_COMMUNITY): Payer: Self-pay

## 2022-09-13 IMAGING — CT CT ABD-PELV W/ CM
2 of 5 series · 16 of 46 positions shown, 18 images · IV contrast (OMNIPAQUE 300)
Comparison: None.

CLINICAL DATA: Prior hernia repair, appendectomy. Discomfort at
hernia area

EXAM:
CT ABDOMEN AND PELVIS WITH CONTRAST
TECHNIQUE: Multidetector CT imaging of the abdomen and pelvis was performed
using the standard protocol following bolus administration of
intravenous contrast. Sagittal and coronal MPR images reconstructed
from axial data set.
CONTRAST:  100mL OMNIPAQUE IOHEXOL 300 MG/ML  SOLN

[Series 2: axial st · axial · 0.93mm/px · z∈[-738,-338]mm · 13 of 94 slices shown, 15 images]
[im 7/94  soft-tissue]
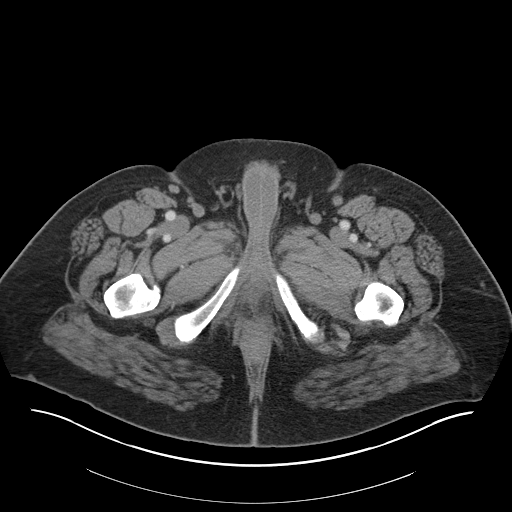
[im 7/94  bone]
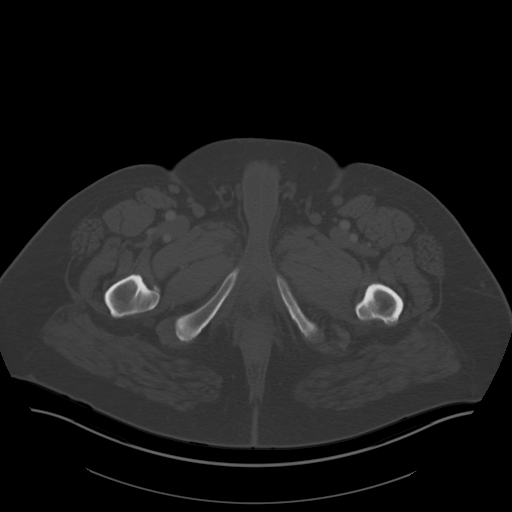
[im 14/94  soft-tissue]
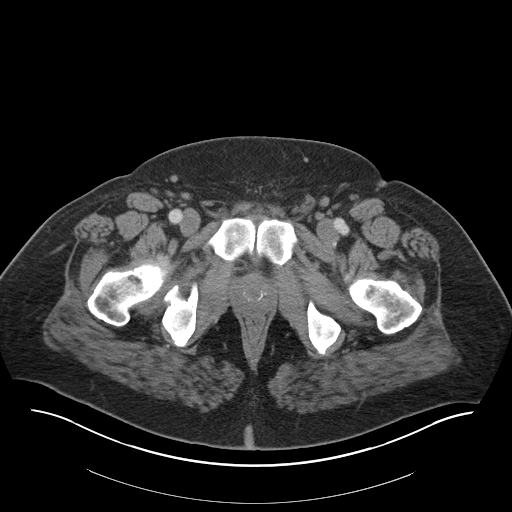
[im 20/94  soft-tissue]
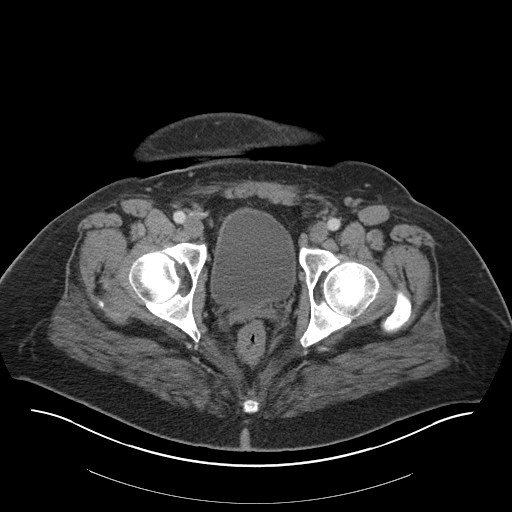
[im 27/94  soft-tissue]
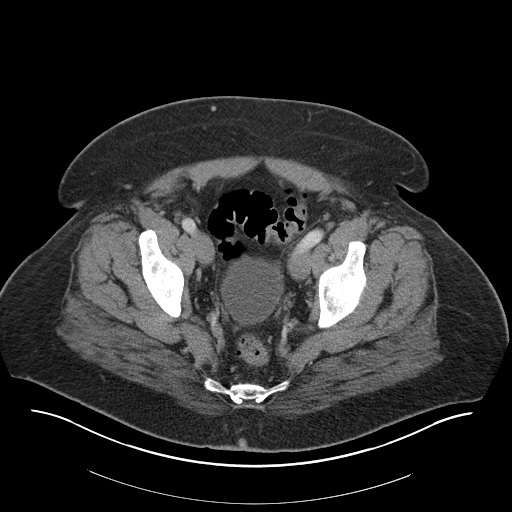
[im 34/94  soft-tissue]
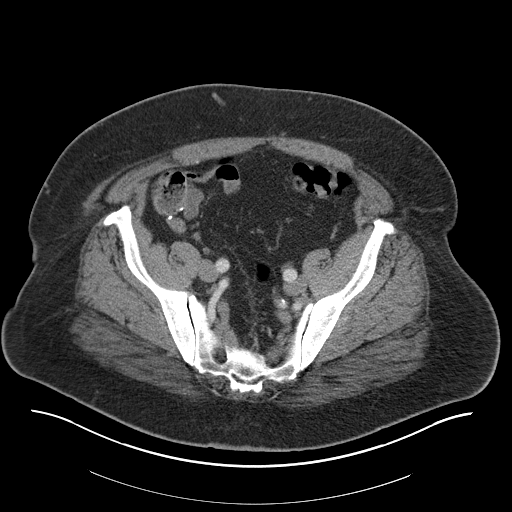
[im 40/94  soft-tissue]
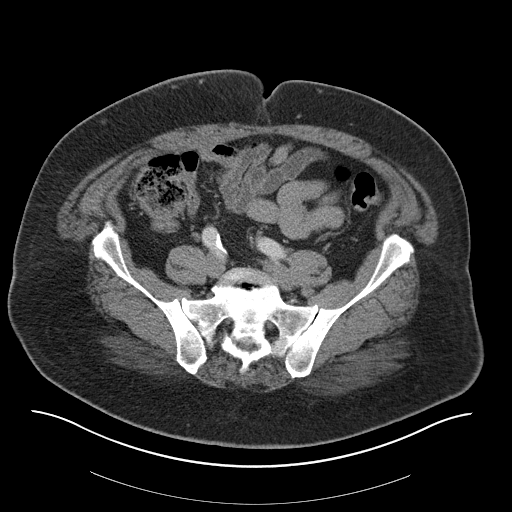
[im 47/94  soft-tissue]
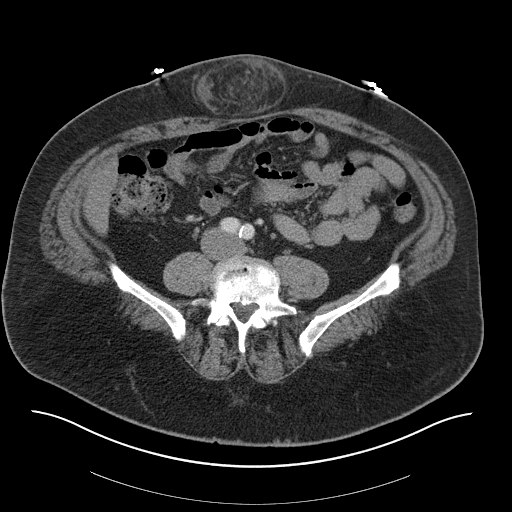
[im 54/94  soft-tissue]
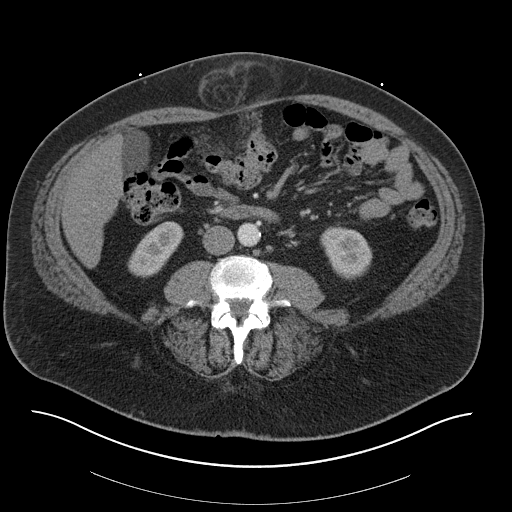
[im 60/94  soft-tissue]
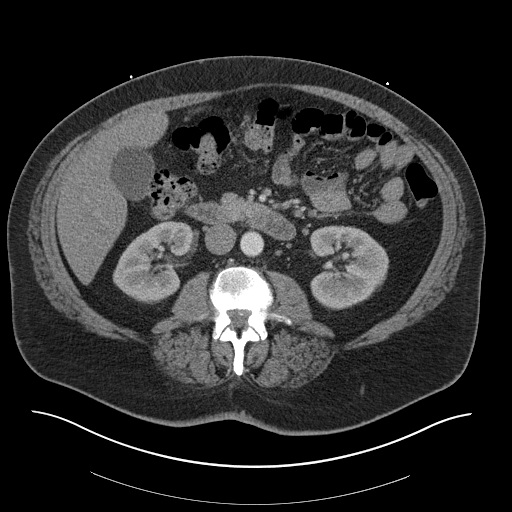
[im 60/94  bone]
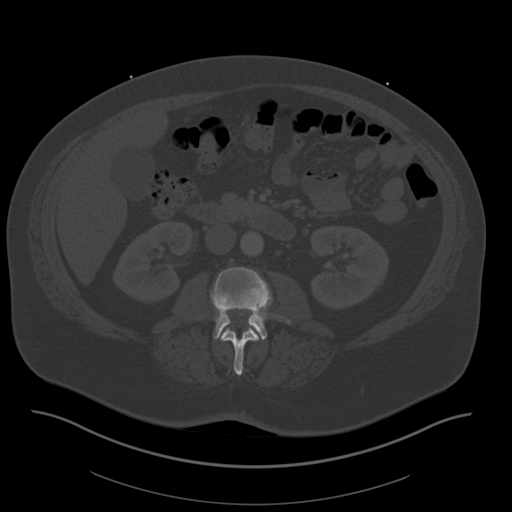
[im 67/94  soft-tissue]
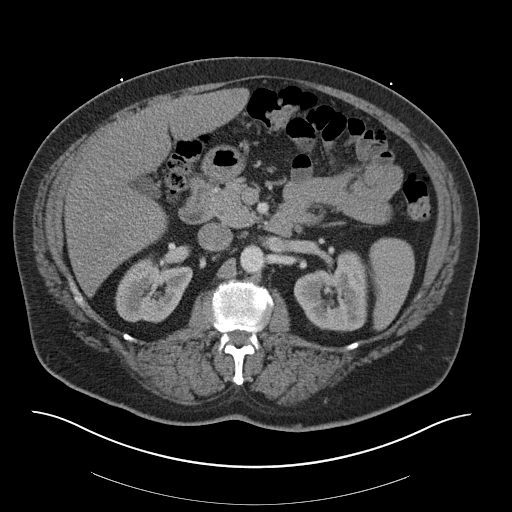
[im 74/94  soft-tissue]
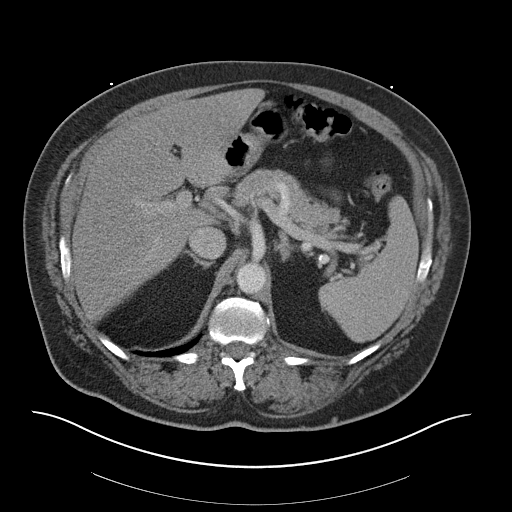
[im 80/94  soft-tissue]
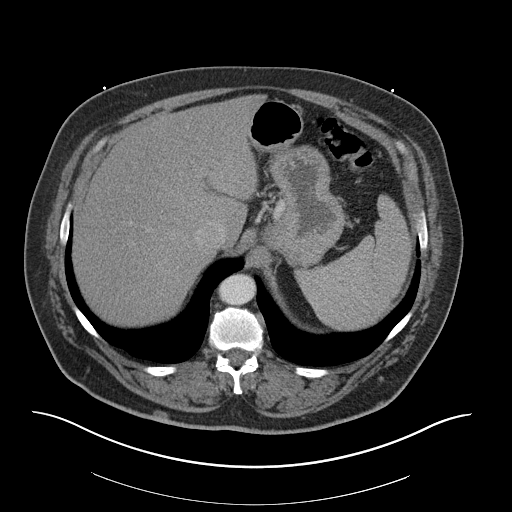
[im 87/94  soft-tissue]
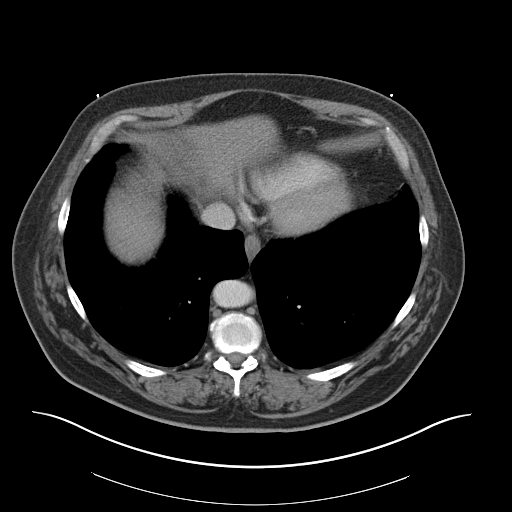

[Series 5: coronal st · coronal · 0.89mm/px · 3 of 188 slices shown]
[im 63/188  soft-tissue]
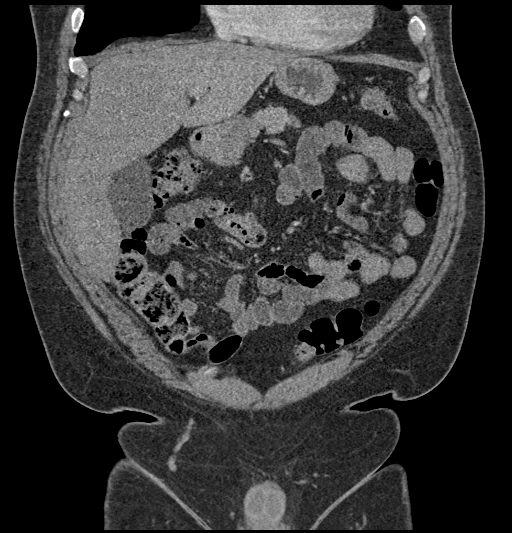
[im 84/188  soft-tissue]
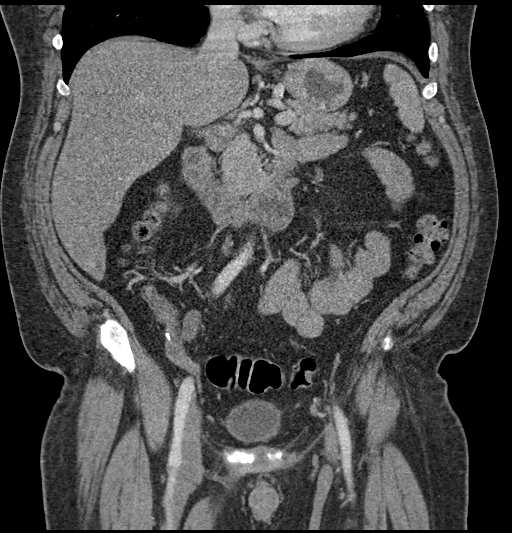
[im 104/188  soft-tissue]
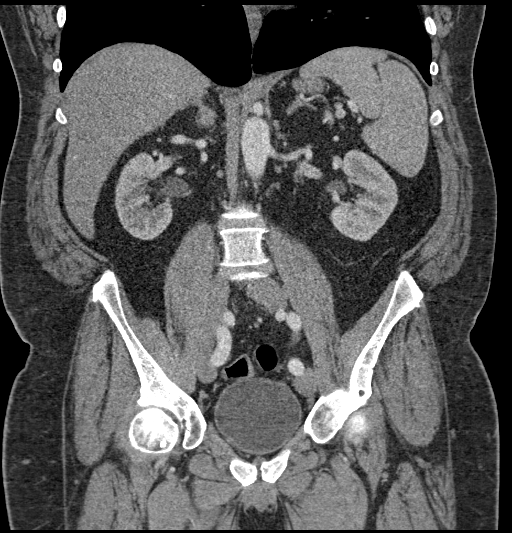

[16 of 46 positions shown; findings below may reference images not displayed]

FINDINGS: Lower chest: Lung bases clear

Hepatobiliary: 7 mm cyst RIGHT lobe liver image 16. Gallbladder and
liver otherwise normal appearance

Pancreas: Normal appearance

Spleen: Normal appearance

Adrenals/Urinary Tract: Adrenal glands, kidneys, ureters, and
bladder normal appearance

Stomach/Bowel: Appendix surgically absent. Stomach and bowel loops
normal appearance

Vascular/Lymphatic: Atherosclerotic calcifications aorta, iliac
arteries, visceral arteries. Aorta normal caliber. No adenopathy.

Reproductive: Unremarkable prostate gland and seminal vesicles

Other: Supraumbilical ventral hernia containing fat, likely the
greater omentum. No bowel herniation. Fascial defect measures 1.9 x
2.0 cm. Mild edema within the hernia sac which could reflect
inflammation or incarceration. No free air or free fluid.

Musculoskeletal: Degenerative disc disease changes lumbar spine with
mild retrolisthesis at L2-L3 and L5-S1.
IMPRESSION: Supraumbilical ventral hernia containing fat, likely the greater
omentum.

Mild edema within the hernia sac which could reflect inflammation or
incarceration.

No bowel herniation.

Remainder of exam unremarkable.

Aortic Atherosclerosis (61BLE-PO2.2).

## 2022-10-12 ENCOUNTER — Other Ambulatory Visit (HOSPITAL_COMMUNITY): Payer: Self-pay

## 2022-10-15 DIAGNOSIS — Z23 Encounter for immunization: Secondary | ICD-10-CM | POA: Diagnosis not present

## 2022-11-15 ENCOUNTER — Other Ambulatory Visit (HOSPITAL_COMMUNITY): Payer: Self-pay

## 2022-11-15 ENCOUNTER — Other Ambulatory Visit: Payer: Self-pay

## 2022-11-19 ENCOUNTER — Other Ambulatory Visit (HOSPITAL_COMMUNITY): Payer: Self-pay

## 2022-11-26 ENCOUNTER — Other Ambulatory Visit (HOSPITAL_COMMUNITY): Payer: Self-pay

## 2022-12-11 ENCOUNTER — Other Ambulatory Visit (HOSPITAL_COMMUNITY): Payer: Self-pay

## 2022-12-19 ENCOUNTER — Other Ambulatory Visit (HOSPITAL_COMMUNITY): Payer: Self-pay

## 2022-12-19 MED ORDER — ALBUTEROL SULFATE HFA 108 (90 BASE) MCG/ACT IN AERS
2.0000 | INHALATION_SPRAY | RESPIRATORY_TRACT | 3 refills | Status: DC | PRN
  Filled 2022-12-19: qty 6.7, 16d supply, fill #0
  Filled 2023-01-09: qty 6.7, 17d supply, fill #1

## 2023-01-08 ENCOUNTER — Other Ambulatory Visit (HOSPITAL_COMMUNITY): Payer: Self-pay

## 2023-01-10 ENCOUNTER — Other Ambulatory Visit (HOSPITAL_COMMUNITY): Payer: Self-pay

## 2023-01-10 ENCOUNTER — Other Ambulatory Visit: Payer: Self-pay

## 2023-02-28 ENCOUNTER — Other Ambulatory Visit (HOSPITAL_COMMUNITY): Payer: Self-pay

## 2023-03-05 DIAGNOSIS — H5213 Myopia, bilateral: Secondary | ICD-10-CM | POA: Diagnosis not present

## 2023-03-05 DIAGNOSIS — H524 Presbyopia: Secondary | ICD-10-CM | POA: Diagnosis not present

## 2023-04-30 ENCOUNTER — Telehealth: Payer: Commercial Managed Care - PPO | Admitting: Adult Health

## 2023-05-02 ENCOUNTER — Encounter: Payer: Self-pay | Admitting: *Deleted

## 2023-05-02 NOTE — Progress Notes (Signed)
PATIENT: Jake Martin DOB: September 08, 1961  REASON FOR VISIT: follow up HISTORY FROM: patient PRIMARY NEUROLOGIST:   Virtual Visit via Video Note  I connected with Jake Martin on 05/06/23 at 11:15 AM EDT by a video enabled telemedicine application located remotely at [facility] and verified that I am speaking with the correct person using two identifiers who was located at their own home.   I discussed the limitations of evaluation and management by telemedicine and the availability of in person appointments. The patient expressed understanding and agreed to proceed.   PATIENT: Jake Martin DOB: 1961-05-30  REASON FOR VISIT: follow up HISTORY FROM: patient  HISTORY OF PRESENT ILLNESS: Today 05/06/23:  Jake Martin is a 62 y.o. male with a history of OSA on CPAP. Returns today for follow-up. Reports that CPAP still working well.  He denies any new issues.       REVIEW OF SYSTEMS: Out of a complete 14 system review of symptoms, the patient complains only of the following symptoms, and all other reviewed systems are negative.  ALLERGIES: Allergies  Allergen Reactions   Dust Mite Extract    Lisinopril Swelling    Lips and eyes swell   Molds & Smuts     HOME MEDICATIONS: Outpatient Medications Prior to Visit  Medication Sig Dispense Refill   albuterol (VENTOLIN HFA) 108 (90 Base) MCG/ACT inhaler Inhale 2 puffs by mouth every 4 hours as needed (Patient taking differently: Inhale 1 puff into the lungs as needed.) 18 g 11   albuterol (VENTOLIN HFA) 108 (90 Base) MCG/ACT inhaler Inhale 2 puffs into the lungs every 4 (four) hours as needed. 6.7 g 3   Albuterol Sulfate (PROAIR RESPICLICK) 108 (90 Base) MCG/ACT AEPB Inhale 1 puff by mouth every 4 hours as needed (Patient taking differently: as needed. Hasnt filled yet 11/16/2021) 1 each 2   amLODipine (NORVASC) 10 MG tablet Take 1 tablet (10 mg total) by mouth daily. 90 tablet 3    Ascorbic Acid (VITAMIN C) 1000 MG tablet Take 1,000 mg by mouth daily.     Aspirin-Acetaminophen 500-325 MG PACK Take 1 packet by mouth every 6 (six) hours as needed (headaches).     budesonide-formoterol (SYMBICORT) 160-4.5 MCG/ACT inhaler INHALE TWO PUFFS BY MOUTH INTO THE LUNGS TWICE DAILY (Patient taking differently: Inhale 2 puffs into the lungs as needed.) 10.2 g 6   cyclobenzaprine (FLEXERIL) 5 MG tablet Take 1 tablet (5 mg total) by mouth every 8 (eight) hours as needed for back spasms. 30 tablet 1   diclofenac (VOLTAREN) 75 MG EC tablet Take 1 tablet by mouth twice a day with a meal. 60 tablet 1   hydrochlorothiazide (HYDRODIURIL) 25 MG tablet Take 25 mg by mouth daily. At bedtime     hydrochlorothiazide (HYDRODIURIL) 25 MG tablet Take 1 tablet (25 mg total) by mouth in the morning. 90 tablet 3   metoprolol succinate (TOPROL XL) 50 MG 24 hr tablet Take one tablet by mouth daily at bedtime. (Patient taking differently: Take by mouth. Not taking due to symptoms) 90 tablet 3   molnupiravir EUA (LAGEVRIO) 200 MG CAPS capsule Take 4 capsules by mouth every 12 hours for 5 days. (Patient taking differently: Take by mouth. Completed dose) 40 capsule 0   Multiple Vitamins-Minerals (MULTIVITAMIN WITH MINERALS) tablet Take 1 tablet by mouth daily.     predniSONE (STERAPRED UNI-PAK 21 TAB) 5 MG (21) TBPK tablet Follow package directions 21 tablet 0  SYMBICORT 160-4.5 MCG/ACT inhaler Inhale 2 puffs into the lungs in the morning and at bedtime.     No facility-administered medications prior to visit.    PAST MEDICAL HISTORY: Past Medical History:  Diagnosis Date   Asthma    Last asthma attack about a year ago 11/16/2021   COVID 11/01/2021   cough and congestion, current cough 11/16/2022   Dyspnea    Headache    Hernia    umbilical hernia   Hyperlipidemia    Hypertension    Obesity    OSA on CPAP    Pneumonia    Wears glasses     PAST SURGICAL HISTORY: Past Surgical History:   Procedure Laterality Date   APPENDECTOMY  2005   EYE SURGERY  1967-1968   Bil   HERNIA REPAIR  2004   LAPAROSCOPIC ASSISTED VENTRAL HERNIA REPAIR Bilateral 01/07/2021   Procedure: diagnostic laparoscopy open repair recurrent incarcerated ventral hernia;  Surgeon: Gaynelle Adu, MD;  Location: [facility];  Service: General;  Laterality: Bilateral;   WISDOM TOOTH EXTRACTION     WRIST ARTHROSCOPY Right 11/23/2021   Procedure: right wrist scapholunate ligament repair possible reconstruction;  Surgeon: [name], MD;  Location: [facility] ;  Service: Orthopedics;  Laterality: Right;    FAMILY HISTORY: Family History  Problem Relation Age of Onset   Hypertension Sister    COPD Father    Hypertension Father    Aneurysm Mother        BRAIN   Diabetes Other    Thyroid disease Other    Diabetes type II Other        GRANDFATHER   Coronary artery disease Other        GRANDMOTHER   Diabetes Paternal Grandmother    Diabetes Son     SOCIAL HISTORY: Social History   Socioeconomic History   Marital status: Married    Spouse name: [name]   Number of children: 2   Years of education: 15   Highest education level: Not on file  Occupational History    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  Tobacco Use   Smoking status: Never   Smokeless tobacco: Current    Types: Chew   Tobacco comments:    Chews daily  Vaping Use   Vaping Use: Never used  Substance and Sexual Activity   Alcohol use: Yes    Alcohol/week: 2.0 standard drinks of alcohol    Types: 2 Standard drinks or equivalent per week    Comment: wine 3-5 glasses weekly   Drug use: No   Sexual activity: Not on file  Other Topics Concern   Not on file  Social History Narrative   Right handed and consumes 3-5 cups of caffeine daily   Social Determinants of Health   Financial Resource Strain: Not on file  Food Insecurity: Not on file  Transportation Needs: Not on file  Physical Activity: Not on file  Stress: Not on file   Social Connections: Not on file  Intimate Partner Violence: Not on file      PHYSICAL EXAM Generalized: Well developed, in no acute distress   Neurological examination  Mentation: Alert oriented to time, place, history taking. Follows all commands speech and language fluent Cranial nerve II-XII: Facial symmetry noted DIAGNOSTIC DATA (LABS, IMAGING, TESTING) - I reviewed patient records, labs, notes, testing and imaging myself where available.  Lab Results  Component Value Date   WBC 10.3 01/08/2021   HGB 17.0 11/23/2021   HCT 50.0 11/23/2021  MCV 87.9 01/08/2021   PLT 180 01/08/2021      Component Value Date/Time   NA 139 11/23/2021 0701   K 3.7 11/23/2021 0701   CL 103 11/23/2021 0701   CO2 28 11/23/2021 0701   GLUCOSE 109 (H) 11/23/2021 0701   BUN 17 11/23/2021 0701   CREATININE 0.79 11/23/2021 0701   CALCIUM 9.3 11/23/2021 0701   PROT 8.0 01/07/2021 0934   ALBUMIN 4.3 01/07/2021 0934   AST 23 01/07/2021 0934   ALT 20 01/07/2021 0934   ALKPHOS 50 01/07/2021 0934   BILITOT 1.2 01/07/2021 0934   GFRNONAA >60 11/23/2021 0701    ASSESSMENT AND PLAN 62 y.o. year old male  has a past medical history of Asthma, COVID (11/01/2021), Dyspnea, Headache, Hernia, Hyperlipidemia, Hypertension, Obesity, OSA on CPAP, Pneumonia, and Wears glasses. here with:  OSA on CPAP  CPAP compliance excellent Residual AHI is good Encouraged patient to continue using CPAP nightly and > 4 hours each night F/U in 1 year or sooner if needed   Butch Penny, MSN, NP-C 05/06/2023, 10:51 AM [facility] [address] [place], [place] [address] [phone]

## 2023-05-06 ENCOUNTER — Telehealth (INDEPENDENT_AMBULATORY_CARE_PROVIDER_SITE_OTHER): Payer: Commercial Managed Care - PPO | Admitting: Adult Health

## 2023-05-06 DIAGNOSIS — G4733 Obstructive sleep apnea (adult) (pediatric): Secondary | ICD-10-CM | POA: Diagnosis not present

## 2023-06-26 ENCOUNTER — Other Ambulatory Visit (HOSPITAL_COMMUNITY): Payer: Self-pay

## 2023-08-01 ENCOUNTER — Other Ambulatory Visit (HOSPITAL_COMMUNITY): Payer: Self-pay

## 2023-08-02 ENCOUNTER — Other Ambulatory Visit (HOSPITAL_COMMUNITY): Payer: Self-pay

## 2023-08-02 MED ORDER — AMLODIPINE BESYLATE 10 MG PO TABS
10.0000 mg | ORAL_TABLET | Freq: Every day | ORAL | 3 refills | Status: DC
  Filled 2023-08-02: qty 90, 90d supply, fill #0

## 2023-08-02 MED ORDER — ALBUTEROL SULFATE HFA 108 (90 BASE) MCG/ACT IN AERS
2.0000 | INHALATION_SPRAY | RESPIRATORY_TRACT | 2 refills | Status: DC | PRN
  Filled 2023-08-02: qty 6.7, 17d supply, fill #0

## 2023-08-05 ENCOUNTER — Other Ambulatory Visit (HOSPITAL_COMMUNITY): Payer: Self-pay

## 2023-08-05 ENCOUNTER — Other Ambulatory Visit: Payer: Self-pay

## 2023-09-10 DIAGNOSIS — Z0000 Encounter for general adult medical examination without abnormal findings: Secondary | ICD-10-CM | POA: Diagnosis not present

## 2023-09-10 DIAGNOSIS — E785 Hyperlipidemia, unspecified: Secondary | ICD-10-CM | POA: Diagnosis not present

## 2023-09-10 DIAGNOSIS — Z1212 Encounter for screening for malignant neoplasm of rectum: Secondary | ICD-10-CM | POA: Diagnosis not present

## 2023-09-10 DIAGNOSIS — R7301 Impaired fasting glucose: Secondary | ICD-10-CM | POA: Diagnosis not present

## 2023-09-10 DIAGNOSIS — I10 Essential (primary) hypertension: Secondary | ICD-10-CM | POA: Diagnosis not present

## 2023-09-10 DIAGNOSIS — Z0189 Encounter for other specified special examinations: Secondary | ICD-10-CM | POA: Diagnosis not present

## 2023-09-17 DIAGNOSIS — R82998 Other abnormal findings in urine: Secondary | ICD-10-CM | POA: Diagnosis not present

## 2023-09-17 DIAGNOSIS — R7301 Impaired fasting glucose: Secondary | ICD-10-CM | POA: Diagnosis not present

## 2023-09-17 DIAGNOSIS — I10 Essential (primary) hypertension: Secondary | ICD-10-CM | POA: Diagnosis not present

## 2023-09-17 DIAGNOSIS — J309 Allergic rhinitis, unspecified: Secondary | ICD-10-CM | POA: Diagnosis not present

## 2023-09-17 DIAGNOSIS — Z0000 Encounter for general adult medical examination without abnormal findings: Secondary | ICD-10-CM | POA: Diagnosis not present

## 2023-09-17 DIAGNOSIS — G4733 Obstructive sleep apnea (adult) (pediatric): Secondary | ICD-10-CM | POA: Diagnosis not present

## 2023-09-17 DIAGNOSIS — M545 Low back pain, unspecified: Secondary | ICD-10-CM | POA: Diagnosis not present

## 2023-09-17 DIAGNOSIS — E785 Hyperlipidemia, unspecified: Secondary | ICD-10-CM | POA: Diagnosis not present

## 2023-09-17 DIAGNOSIS — Z23 Encounter for immunization: Secondary | ICD-10-CM | POA: Diagnosis not present

## 2023-09-17 DIAGNOSIS — I872 Venous insufficiency (chronic) (peripheral): Secondary | ICD-10-CM | POA: Diagnosis not present

## 2023-09-19 ENCOUNTER — Other Ambulatory Visit (HOSPITAL_COMMUNITY): Payer: Self-pay

## 2023-09-19 ENCOUNTER — Other Ambulatory Visit: Payer: Self-pay

## 2023-09-19 MED ORDER — HYDROCHLOROTHIAZIDE 25 MG PO TABS
25.0000 mg | ORAL_TABLET | Freq: Every day | ORAL | 3 refills | Status: AC
  Filled 2023-09-19: qty 90, 90d supply, fill #0
  Filled 2024-03-23: qty 90, 90d supply, fill #2

## 2023-10-15 ENCOUNTER — Other Ambulatory Visit (HOSPITAL_COMMUNITY): Payer: Self-pay

## 2023-10-15 DIAGNOSIS — I10 Essential (primary) hypertension: Secondary | ICD-10-CM | POA: Diagnosis not present

## 2023-10-15 DIAGNOSIS — J309 Allergic rhinitis, unspecified: Secondary | ICD-10-CM | POA: Diagnosis not present

## 2023-10-15 DIAGNOSIS — R0981 Nasal congestion: Secondary | ICD-10-CM | POA: Diagnosis not present

## 2023-10-15 DIAGNOSIS — Z1152 Encounter for screening for COVID-19: Secondary | ICD-10-CM | POA: Diagnosis not present

## 2023-10-15 DIAGNOSIS — J189 Pneumonia, unspecified organism: Secondary | ICD-10-CM | POA: Diagnosis not present

## 2023-10-15 DIAGNOSIS — R051 Acute cough: Secondary | ICD-10-CM | POA: Diagnosis not present

## 2023-10-15 DIAGNOSIS — J029 Acute pharyngitis, unspecified: Secondary | ICD-10-CM | POA: Diagnosis not present

## 2023-10-15 MED ORDER — AZITHROMYCIN 250 MG PO TABS
ORAL_TABLET | ORAL | 0 refills | Status: AC
  Filled 2023-10-15: qty 6, 5d supply, fill #0

## 2023-10-15 MED ORDER — CEFDINIR 300 MG PO CAPS
300.0000 mg | ORAL_CAPSULE | Freq: Two times a day (BID) | ORAL | 0 refills | Status: DC
  Filled 2023-10-15: qty 14, 7d supply, fill #0

## 2023-10-30 ENCOUNTER — Other Ambulatory Visit (HOSPITAL_COMMUNITY): Payer: Self-pay

## 2023-10-30 ENCOUNTER — Other Ambulatory Visit: Payer: Self-pay

## 2023-10-30 MED ORDER — ROSUVASTATIN CALCIUM 20 MG PO TABS
20.0000 mg | ORAL_TABLET | Freq: Every day | ORAL | 11 refills | Status: DC
  Filled 2023-10-30: qty 30, 30d supply, fill #0

## 2023-10-31 DIAGNOSIS — G4733 Obstructive sleep apnea (adult) (pediatric): Secondary | ICD-10-CM | POA: Diagnosis not present

## 2023-10-31 DIAGNOSIS — I2510 Atherosclerotic heart disease of native coronary artery without angina pectoris: Secondary | ICD-10-CM | POA: Diagnosis not present

## 2023-10-31 DIAGNOSIS — R7301 Impaired fasting glucose: Secondary | ICD-10-CM | POA: Diagnosis not present

## 2023-10-31 DIAGNOSIS — E785 Hyperlipidemia, unspecified: Secondary | ICD-10-CM | POA: Diagnosis not present

## 2023-10-31 DIAGNOSIS — I10 Essential (primary) hypertension: Secondary | ICD-10-CM | POA: Diagnosis not present

## 2023-11-05 ENCOUNTER — Other Ambulatory Visit (HOSPITAL_COMMUNITY): Payer: Self-pay

## 2023-11-05 MED ORDER — SEMAGLUTIDE-WEIGHT MANAGEMENT 0.25 MG/0.5ML ~~LOC~~ SOAJ
0.2500 mg | SUBCUTANEOUS | 1 refills | Status: DC
  Filled 2023-11-18 (×2): qty 2, 28d supply, fill #0

## 2023-11-07 ENCOUNTER — Other Ambulatory Visit (HOSPITAL_COMMUNITY): Payer: Self-pay

## 2023-11-07 ENCOUNTER — Other Ambulatory Visit: Payer: Self-pay

## 2023-11-07 MED ORDER — AMLODIPINE BESYLATE 10 MG PO TABS
10.0000 mg | ORAL_TABLET | Freq: Every day | ORAL | 3 refills | Status: AC
  Filled 2023-11-07 – 2024-05-02 (×2): qty 90, 90d supply, fill #0

## 2023-11-07 MED ORDER — AMLODIPINE BESYLATE 10 MG PO TABS
10.0000 mg | ORAL_TABLET | Freq: Every day | ORAL | 0 refills | Status: DC
  Filled 2023-11-07 (×2): qty 90, 90d supply, fill #0

## 2023-11-08 DIAGNOSIS — M1712 Unilateral primary osteoarthritis, left knee: Secondary | ICD-10-CM | POA: Diagnosis not present

## 2023-11-18 ENCOUNTER — Other Ambulatory Visit (HOSPITAL_COMMUNITY): Payer: Self-pay

## 2023-11-19 ENCOUNTER — Other Ambulatory Visit (HOSPITAL_COMMUNITY): Payer: Self-pay

## 2023-11-19 DIAGNOSIS — I10 Essential (primary) hypertension: Secondary | ICD-10-CM | POA: Diagnosis not present

## 2023-11-19 DIAGNOSIS — J0100 Acute maxillary sinusitis, unspecified: Secondary | ICD-10-CM | POA: Diagnosis not present

## 2023-11-19 DIAGNOSIS — R0981 Nasal congestion: Secondary | ICD-10-CM | POA: Diagnosis not present

## 2023-11-19 MED ORDER — AMOXICILLIN-POT CLAVULANATE 875-125 MG PO TABS
875.0000 mg | ORAL_TABLET | Freq: Two times a day (BID) | ORAL | 0 refills | Status: DC
  Filled 2023-11-19: qty 20, 10d supply, fill #0

## 2023-11-27 DIAGNOSIS — I10 Essential (primary) hypertension: Secondary | ICD-10-CM | POA: Diagnosis not present

## 2023-11-27 DIAGNOSIS — M545 Low back pain, unspecified: Secondary | ICD-10-CM | POA: Diagnosis not present

## 2023-11-27 DIAGNOSIS — G4733 Obstructive sleep apnea (adult) (pediatric): Secondary | ICD-10-CM | POA: Diagnosis not present

## 2023-11-27 DIAGNOSIS — I2510 Atherosclerotic heart disease of native coronary artery without angina pectoris: Secondary | ICD-10-CM | POA: Diagnosis not present

## 2023-11-27 DIAGNOSIS — R7301 Impaired fasting glucose: Secondary | ICD-10-CM | POA: Diagnosis not present

## 2023-11-27 DIAGNOSIS — I872 Venous insufficiency (chronic) (peripheral): Secondary | ICD-10-CM | POA: Diagnosis not present

## 2023-11-27 DIAGNOSIS — J0100 Acute maxillary sinusitis, unspecified: Secondary | ICD-10-CM | POA: Diagnosis not present

## 2023-11-27 DIAGNOSIS — E785 Hyperlipidemia, unspecified: Secondary | ICD-10-CM | POA: Diagnosis not present

## 2023-11-27 DIAGNOSIS — J309 Allergic rhinitis, unspecified: Secondary | ICD-10-CM | POA: Diagnosis not present

## 2023-12-02 ENCOUNTER — Other Ambulatory Visit (HOSPITAL_COMMUNITY): Payer: Self-pay

## 2023-12-02 MED ORDER — AZITHROMYCIN 250 MG PO TABS
ORAL_TABLET | ORAL | 0 refills | Status: DC
  Filled 2023-12-02: qty 6, 5d supply, fill #0

## 2023-12-06 ENCOUNTER — Other Ambulatory Visit (HOSPITAL_BASED_OUTPATIENT_CLINIC_OR_DEPARTMENT_OTHER): Payer: Self-pay

## 2023-12-06 ENCOUNTER — Other Ambulatory Visit (HOSPITAL_COMMUNITY): Payer: Self-pay

## 2023-12-06 MED ORDER — AMOXICILLIN-POT CLAVULANATE 875-125 MG PO TABS
1.0000 | ORAL_TABLET | Freq: Two times a day (BID) | ORAL | 0 refills | Status: AC
  Filled 2023-12-06 (×2): qty 14, 7d supply, fill #0

## 2023-12-19 DIAGNOSIS — G4733 Obstructive sleep apnea (adult) (pediatric): Secondary | ICD-10-CM | POA: Diagnosis not present

## 2023-12-29 ENCOUNTER — Other Ambulatory Visit: Payer: Self-pay

## 2024-01-22 ENCOUNTER — Other Ambulatory Visit (HOSPITAL_COMMUNITY): Payer: Self-pay

## 2024-01-23 ENCOUNTER — Other Ambulatory Visit: Payer: Self-pay

## 2024-01-23 ENCOUNTER — Other Ambulatory Visit (HOSPITAL_COMMUNITY): Payer: Self-pay

## 2024-01-23 MED ORDER — AMLODIPINE BESYLATE 10 MG PO TABS
10.0000 mg | ORAL_TABLET | Freq: Every day | ORAL | 3 refills | Status: DC
  Filled 2024-01-23: qty 90, 90d supply, fill #0

## 2024-01-29 ENCOUNTER — Telehealth: Payer: Self-pay | Admitting: Pharmacist Clinician (PhC)/ Clinical Pharmacy Specialist

## 2024-01-29 ENCOUNTER — Encounter: Payer: Self-pay | Admitting: Cardiovascular Disease

## 2024-01-29 ENCOUNTER — Ambulatory Visit: Payer: Commercial Managed Care - PPO | Attending: Cardiovascular Disease | Admitting: Cardiovascular Disease

## 2024-01-29 ENCOUNTER — Other Ambulatory Visit (HOSPITAL_COMMUNITY): Payer: Self-pay

## 2024-01-29 ENCOUNTER — Telehealth: Payer: Self-pay | Admitting: Pharmacy Technician

## 2024-01-29 VITALS — BP 142/90 | HR 64 | Ht 72.0 in | Wt 294.0 lb

## 2024-01-29 DIAGNOSIS — Z7689 Persons encountering health services in other specified circumstances: Secondary | ICD-10-CM | POA: Diagnosis not present

## 2024-01-29 DIAGNOSIS — I25810 Atherosclerosis of coronary artery bypass graft(s) without angina pectoris: Secondary | ICD-10-CM | POA: Diagnosis not present

## 2024-01-29 DIAGNOSIS — G4733 Obstructive sleep apnea (adult) (pediatric): Secondary | ICD-10-CM | POA: Diagnosis not present

## 2024-01-29 DIAGNOSIS — E785 Hyperlipidemia, unspecified: Secondary | ICD-10-CM | POA: Insufficient documentation

## 2024-01-29 DIAGNOSIS — R931 Abnormal findings on diagnostic imaging of heart and coronary circulation: Secondary | ICD-10-CM | POA: Insufficient documentation

## 2024-01-29 DIAGNOSIS — E782 Mixed hyperlipidemia: Secondary | ICD-10-CM

## 2024-01-29 DIAGNOSIS — Z6841 Body Mass Index (BMI) 40.0 and over, adult: Secondary | ICD-10-CM

## 2024-01-29 NOTE — Assessment & Plan Note (Signed)
 History of hyperlipidemia intolerant to sticks to statin drugs with lipid profile performed 09/11/2023 revealing total cholesterol 155, LDL of 92 and HDL 49, not at goal for secondary prevention given his elevated coronary calcium score.  Will pursue PCSK9 with LDL goal less than 70.

## 2024-01-29 NOTE — Telephone Encounter (Signed)
 Patient in office to see Dr. Allyson Sabal.  Has CAD with CAC 621 and intolerant to 2 statin drugs.  Will send request to get PA for Repatha.   Reviewed injection technique and medication information with patient and wife.     Please do PA for Repatha

## 2024-01-29 NOTE — Assessment & Plan Note (Signed)
 Coronary calcium score performed 10/23/23 was 621 with significant plaque in the LAD as described in the referring note.  His LDL unfortunately is elevated at 92 intolerant to statin therapy.  He is completely asymptomatic.  I am going to get a Lexiscan Myoview to her stratify him and optimize his risk factors including adding a PCSK9 for hyperlipidemia with LDL target of less than 70.

## 2024-01-29 NOTE — Patient Instructions (Addendum)
 EKG anything there that catches your medication Instructions:  Your physician recommends that you continue on your current medications as directed. Please refer to the Current Medication list given to you today.  *If you need a refill on your cardiac medications before your next appointment, please call your pharmacy*   Lab Work: Your physician recommends that you return for lab work in: 3 months for FASTING Lipid panel- (after starting Repatha).  If you have labs (blood work) drawn today and your tests are completely normal, you will receive your results only by: MyChart Message (if you have MyChart) OR A paper copy in the mail If you have any lab test that is abnormal or we need to change your treatment, we will call you to review the results.  Repatha Co-pay Card Instructions 1.      Go to [email or web address]  2.      Click the red "Repahta Co-Pay Card" button near the top right 3.      Scroll down and click "Yes, I have a Repatha prescription 4.      Continue to scroll down and selected "[employer]"  5.      Continue to scroll down and for the question "Are you eligible for Medicare but receiving prescription drug coverage from a former employer, union, or welfare plan?" select "No" 6.      Continue to scroll down and click the first box which is next to "Repatha Co-Pay Card, and beneath that, select "I want to enroll in the Repatha Co-Pay Card Program" 7.      Continue to scroll down until you see the "Next" button, then click it 8.      Fill out your personal information then click the "Next" button   Testing/Procedures: Dr. Allyson Sabal has ordered a Lexiscan Myocardial Perfusion Imaging Study.  Please arrive 15 minutes prior to your appointment time for registration and insurance purposes.   The test will take approximately 3 to 4 hours to complete; you may bring reading material.  If someone comes with you to your appointment, they will need to remain in the main lobby  due to limited space in the testing area. **If you are pregnant or breastfeeding, please notify the nuclear lab prior to your appointment**   How to prepare for your Myocardial Perfusion Test: Do not eat or drink 3 hours prior to your test, except you may have water. Do not consume products containing caffeine (regular or decaffeinated) 12 hours prior to your test. (ex: coffee, chocolate, sodas, tea). Do wear comfortable clothes (no dresses or overalls) and walking shoes, tennis shoes preferred (No heels or open toe shoes are allowed). Do NOT wear cologne, perfume, aftershave, or lotions (deodorant is allowed). If you use an inhaler, use it the AM of your test and bring it with you.  If you use a nebulizer, use it the AM of your test.  If these instructions are not followed, your test will have to be rescheduled.    Follow-Up: At [facility], you and your health needs are our priority.  As part of our continuing mission to provide you with exceptional heart care, we have created designated Provider Care Teams.  These Care Teams include your primary Cardiologist (physician) and Advanced Practice Providers (APPs -  Physician Assistants and Nurse Practitioners) who all work together to provide you with the care you need, when you need it.  We recommend signing up for the patient portal called "MyChart".  Sign up information  is provided on this After Visit Summary.  MyChart is used to connect with patients for Virtual Visits (Telemedicine).  Patients are able to view lab/test results, encounter notes, upcoming appointments, etc.  Non-urgent messages can be sent to your provider as well.   To learn more about what you can do with MyChart, go to [email or web address].    Your next appointment:   6 month(s)  Provider:   Nanetta Batty, MD      Other Instructions   1st Floor: - Lobby - Registration  - Pharmacy  - Lab - Cafe  2nd Floor: - PV Lab - Diagnostic Testing (echo,  CT, nuclear med)  3rd Floor: - Vacant  4th Floor: - TCTS (cardiothoracic surgery) - AFib Clinic - Structural Heart Clinic - Vascular Surgery  - Vascular Ultrasound  5th Floor: - HeartCare Cardiology (general and EP) - Clinical Pharmacy for coumadin, hypertension, lipid, weight-loss medications, and med management appointments    Valet parking services will be available as well.   Heart-Healthy Eating Plan Eating a healthy diet is important for the health of your heart. A heart-healthy eating plan includes: Eating less unhealthy fats. Eating more healthy fats. Eating less salt in your food. Salt is also called sodium. Making other changes in your diet. Cooking Avoid frying your food. Try to bake, boil, grill, or broil it instead. You can also reduce fat by: Removing the skin from poultry. Removing all visible fats from meats. Steaming vegetables in water or broth. Meal planning  At meals, divide your plate into four equal parts: Fill one-half of your plate with vegetables and green salads. Fill one-fourth of your plate with whole grains. Fill one-fourth of your plate with lean protein foods. Eat 2-4 cups of vegetables per day. One cup of vegetables is: 1 cup (91 g) broccoli or cauliflower florets. 2 medium carrots. 1 large bell pepper. 1 large sweet potato. 1 large tomato. 1 medium white potato. 2 cups (150 g) raw leafy greens. Eat 1-2 cups of fruit per day. One cup of fruit is: 1 small apple 1 large banana 1 cup (237 g) mixed fruit, 1 large orange,  cup (82 g) dried fruit, 1 cup (240 mL) 100% fruit juice. Eat more foods that have soluble fiber. These are apples, broccoli, carrots, beans, peas, and barley. Try to get 20-30 g of fiber per day. Eat 4-5 servings of nuts, legumes, and seeds per week: 1 serving of dried beans or legumes equals  cup (90 g) cooked. 1 serving of nuts is  oz (12 almonds, 24 pistachios, or 7 walnut halves). 1 serving of seeds equals   oz (8 g). General information Eat more home-cooked food. Eat less restaurant, buffet, and fast food. Limit or avoid alcohol. Limit foods that are high in starch and sugar. Avoid fried foods. Lose weight if you are overweight. Keep track of how much salt (sodium) you eat. This is important if you have high blood pressure. Ask your doctor to tell you more about this. Try to add vegetarian meals each week. Fats Choose healthy fats. These include olive oil and canola oil, flaxseeds, walnuts, almonds, and seeds. Eat more omega-3 fats. These include salmon, mackerel, sardines, tuna, flaxseed oil, and ground flaxseeds. Try to eat fish at least 2 times each week. Check food labels. Avoid foods with trans fats or high amounts of saturated fat. Limit saturated fats. These are often found in animal products, such as meats, butter, and cream. These are also found in plant foods, such  as palm oil, palm kernel oil, and coconut oil. Avoid foods with partially hydrogenated oils in them. These have trans fats. Examples are stick margarine, some tub margarines, cookies, crackers, and other baked goods. What foods should I eat? Fruits All fresh, canned (in natural juice), or frozen fruits. Vegetables Fresh or frozen vegetables (raw, steamed, roasted, or grilled). Green salads. Grains Most grains. Choose whole wheat and whole grains most of the time. Rice and pasta, including brown rice and pastas made with whole wheat. Meats and other proteins Lean, well-trimmed beef, veal, pork, and lamb. Chicken and [place] without skin. All fish and shellfish. Wild duck, rabbit, pheasant, and venison. Egg whites or low-cholesterol egg substitutes. Dried beans, peas, lentils, and tofu. Seeds and most nuts. Dairy Low-fat or nonfat cheeses, including ricotta and mozzarella. Skim or 1% milk that is liquid, powdered, or evaporated. Buttermilk that is made with low-fat milk. Nonfat or low-fat yogurt. Fats and  oils Non-hydrogenated (trans-free) margarines. Vegetable oils, including soybean, sesame, sunflower, olive, peanut, safflower, corn, canola, and cottonseed. Salad dressings or mayonnaise made with a vegetable oil. Beverages Mineral water. Coffee and tea. Diet carbonated beverages. Sweets and desserts Sherbet, gelatin, and fruit ice. Small amounts of dark chocolate. Limit all sweets and desserts. Seasonings and condiments All seasonings and condiments. The items listed above may not be a complete list of foods and drinks you can eat. Contact a dietitian for more options. What foods should I avoid? Fruits Canned fruit in heavy syrup. Fruit in cream or butter sauce. Fried fruit. Limit coconut. Vegetables Vegetables cooked in cheese, cream, or butter sauce. Fried vegetables. Grains Breads that are made with saturated or trans fats, oils, or whole milk. Croissants. Sweet rolls. Donuts. High-fat crackers, such as cheese crackers. Meats and other proteins Fatty meats, such as hot dogs, ribs, sausage, bacon, rib-eye roast or steak. High-fat deli meats, such as salami and bologna. Caviar. Domestic duck and goose. Organ meats, such as liver. Dairy Cream, sour cream, cream cheese, and creamed cottage cheese. Whole-milk cheeses. Whole or 2% milk that is liquid, evaporated, or condensed. Whole buttermilk. Cream sauce or high-fat cheese sauce. Yogurt that is made from whole milk. Fats and oils Meat fat, or shortening. Cocoa butter, hydrogenated oils, palm oil, coconut oil, palm kernel oil. Solid fats and shortenings, including bacon fat, salt pork, lard, and butter. Nondairy cream substitutes. Salad dressings with cheese or sour cream. Beverages Regular sodas and juice drinks with added sugar. Sweets and desserts Frosting. Pudding. Cookies. Cakes. Pies. Milk chocolate or white chocolate. Buttered syrups. Full-fat ice cream or ice cream drinks. The items listed above may not be a complete list of foods  and drinks to avoid. Contact a dietitian for more information. Summary Heart-healthy meal planning includes eating less unhealthy fats, eating more healthy fats, and making other changes in your diet. Eat a balanced diet. This includes fruits and vegetables, low-fat or nonfat dairy, lean protein, nuts and legumes, whole grains, and heart-healthy oils and fats. This information is not intended to replace advice given to you by your health care provider. Make sure you discuss any questions you have with your health care provider. Document Revised: 12/11/2021 Document Reviewed: 12/11/2021 Elsevier Patient Education  2024 [employer].

## 2024-01-29 NOTE — Assessment & Plan Note (Signed)
 On CPAP. ?

## 2024-01-29 NOTE — Progress Notes (Signed)
 01/29/2024 Jake Martin   [birth year]  [id number]  Primary Physician Chilton Greathouse, MD Primary Cardiologist: Runell Gess MD Nicholes Calamity, [place]  HPI:  Jake Martin is a 63 y.o. 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  He is accompanied by his wife Jake Martin today.  He does drink socially on occasions (bourbon).  His risk factors include hyperlipidemia intolerant to statin therapy.  He does have obstructive sleep apnea on CPAP as well as reactive airways disease.  There is no family history of heart disease.  Never had a heart attack or stroke.  He denies chest pain or shortness of breath.  He does play golf and shoots skeet.   Current Meds  Medication Sig   Albuterol Sulfate (PROAIR RESPICLICK) 108 (90 Base) MCG/ACT AEPB Inhale 1 puff by mouth every 4 hours as needed (Patient taking differently: as needed. Hasnt filled yet 11/16/2021)   amLODipine (NORVASC) 10 MG tablet Take 1 tablet (10 mg total) by mouth daily.   amLODipine (NORVASC) 10 MG tablet Take 1 tablet (10 mg total) by mouth daily.   Aspirin-Acetaminophen 500-325 MG PACK Take 1 packet by mouth every 6 (six) hours as needed (headaches).   hydrochlorothiazide (HYDRODIURIL) 25 MG tablet Take 1 tablet (25 mg total) by mouth daily.   Multiple Vitamins-Minerals (MULTIVITAMIN WITH MINERALS) tablet Take 1 tablet by mouth daily.     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  Tobacco Use   Smoking status: Never   Smokeless tobacco: Current     Types: Chew   Tobacco comments:    Chews daily  Vaping Use   Vaping status: Never Used  Substance and Sexual Activity   Alcohol use: Yes    Alcohol/week: 2.0 standard drinks of alcohol    Types: 2 Standard drinks or equivalent per week    Comment: wine 3-5 glasses weekly   Drug use: No   Sexual activity: Not on file  Other Topics Concern   Not on file  Social History Narrative   Right handed and consumes 3-5 cups of caffeine daily   Social Drivers of Health   Financial Resource Strain: Not on file  Food Insecurity: Not on file  Transportation Needs: Not on file  Physical Activity: Not on file  Stress: Not on file  Social Connections: Not on file  Intimate Partner Violence: Not on file     Review of Systems: General: negative for chills, fever, night sweats or weight changes.  Cardiovascular: negative for chest pain, dyspnea on exertion, edema, orthopnea, palpitations, paroxysmal nocturnal dyspnea or shortness of breath Dermatological: negative for rash Respiratory: negative for cough or wheezing Urologic: negative for hematuria Abdominal: negative for nausea, vomiting, diarrhea, bright red blood per rectum, melena, or hematemesis Neurologic: negative for visual changes, syncope, or dizziness All other systems reviewed and are otherwise negative except as noted above.    Blood pressure (!) 146/98, pulse 64, height 6' (1.829  m), weight 294 lb (133.4 kg), SpO2 92%.  General appearance: alert and no distress Neck: no adenopathy, no carotid bruit, no JVD, supple, symmetrical, trachea midline, and thyroid not enlarged, symmetric, no tenderness/mass/nodules Lungs: clear to auscultation bilaterally Heart: regular rate and rhythm, S1, S2 normal, no murmur, click, rub or gallop Extremities: extremities normal, atraumatic, no cyanosis or edema Pulses: 2+ and symmetric Skin: Skin color, texture, turgor normal. No rashes or lesions Neurologic: Grossly normal  EKG EKG  Interpretation Date/Time:  Wednesday January 29 2024 09:33:02 EDT Ventricular Rate:  64 PR Interval:  240 QRS Duration:  100 QT Interval:  390 QTC Calculation: 402 R Axis:   -15  Text Interpretation: Sinus rhythm with sinus arrhythmia with 1st degree A-V block Anteroseptal infarct (cited on or before 23-Nov-2021) When compared with ECG of 23-Nov-2021 06:30, No significant change was found Confirmed by Nanetta Batty [phone]) on 01/29/2024 9:57:14 AM    ASSESSMENT AND PLAN:   Obstructive sleep apnea On CPAP  Hyperlipidemia History of hyperlipidemia intolerant to sticks to statin drugs with lipid profile performed 09/11/2023 revealing total cholesterol 155, LDL of 92 and HDL 49, not at goal for secondary prevention given his elevated coronary calcium score.  Will pursue PCSK9 with LDL goal less than 70.  Morbid obesity with BMI of 40.0-44.9, adult (HCC) BMI 40.  He discussed GLP-1 agonist with his PCP but with it was not covered by his insurance.  We also discussed referral to the [facility] for diet and exercise and physician assisted weight loss.  He says he can "lose weight on his own".  Elevated coronary artery calcium score Coronary calcium score performed 10/23/23 was 621 with significant plaque in the LAD as described in the referring note.  His LDL unfortunately is elevated at 92 intolerant to statin therapy.  He is completely asymptomatic.  I am going to get a Lexiscan Myoview to her stratify him and optimize his risk factors including adding a PCSK9 for hyperlipidemia with LDL target of less than 70.     Runell Gess MD FACP,FACC,FAHA, [facility] 01/29/2024 10:22 AM

## 2024-01-29 NOTE — Assessment & Plan Note (Signed)
 BMI 40.  He discussed GLP-1 agonist with his PCP but with it was not covered by his insurance.  We also discussed referral to the [facility] for diet and exercise and physician assisted weight loss.  He says he can "lose weight on his own".

## 2024-01-29 NOTE — Telephone Encounter (Signed)
 Pharmacy Patient Advocate Encounter   Received notification from Pt Calls Messages that prior authorization for Repatha is required/requested.   Insurance verification completed.   The patient is insured through [facility] .   Per test claim: PA required; PA submitted to above mentioned insurance via CoverMyMeds Key/confirmation #/EOC [id number] Status is pending

## 2024-01-31 ENCOUNTER — Encounter (HOSPITAL_COMMUNITY): Payer: Self-pay

## 2024-01-31 ENCOUNTER — Other Ambulatory Visit: Payer: Self-pay

## 2024-01-31 ENCOUNTER — Other Ambulatory Visit (HOSPITAL_COMMUNITY): Payer: Self-pay

## 2024-01-31 MED ORDER — REPATHA SURECLICK 140 MG/ML ~~LOC~~ SOAJ
140.0000 mg | SUBCUTANEOUS | 3 refills | Status: AC
  Filled 2024-01-31: qty 6, 84d supply, fill #0

## 2024-01-31 NOTE — Telephone Encounter (Signed)
 Pharmacy Patient Advocate Encounter  Received notification from [facility] that Prior Authorization for Repatha has been APPROVED from 01/31/24 to 01/29/25. Ran test claim, Copay is $24.99- one month. This test claim was processed through [facility]- copay amounts may vary at other pharmacies due to pharmacy/plan contracts, or as the patient moves through the different stages of their insurance plan.   PA #/Case ID/Reference #: [id number]

## 2024-01-31 NOTE — Addendum Note (Signed)
 Addended by: Rosalee Kaufman on: 01/31/2024 10:38 AM   Modules accepted: Orders

## 2024-02-17 DIAGNOSIS — I872 Venous insufficiency (chronic) (peripheral): Secondary | ICD-10-CM | POA: Diagnosis not present

## 2024-02-17 DIAGNOSIS — J309 Allergic rhinitis, unspecified: Secondary | ICD-10-CM | POA: Diagnosis not present

## 2024-02-17 DIAGNOSIS — E785 Hyperlipidemia, unspecified: Secondary | ICD-10-CM | POA: Diagnosis not present

## 2024-02-17 DIAGNOSIS — R7301 Impaired fasting glucose: Secondary | ICD-10-CM | POA: Diagnosis not present

## 2024-02-17 DIAGNOSIS — M545 Low back pain, unspecified: Secondary | ICD-10-CM | POA: Diagnosis not present

## 2024-02-17 DIAGNOSIS — I2510 Atherosclerotic heart disease of native coronary artery without angina pectoris: Secondary | ICD-10-CM | POA: Diagnosis not present

## 2024-02-17 DIAGNOSIS — I10 Essential (primary) hypertension: Secondary | ICD-10-CM | POA: Diagnosis not present

## 2024-02-17 DIAGNOSIS — G4733 Obstructive sleep apnea (adult) (pediatric): Secondary | ICD-10-CM | POA: Diagnosis not present

## 2024-02-18 ENCOUNTER — Telehealth (HOSPITAL_COMMUNITY): Payer: Self-pay

## 2024-02-18 NOTE — Telephone Encounter (Signed)
 Spoke with the patient, detailed instructions given. Asked to call back with any questions. S.Stayce Delancy CCT

## 2024-02-24 ENCOUNTER — Ambulatory Visit (HOSPITAL_COMMUNITY): Attending: Cardiovascular Disease

## 2024-02-24 DIAGNOSIS — I25810 Atherosclerosis of coronary artery bypass graft(s) without angina pectoris: Secondary | ICD-10-CM | POA: Diagnosis not present

## 2024-02-24 DIAGNOSIS — G4733 Obstructive sleep apnea (adult) (pediatric): Secondary | ICD-10-CM | POA: Diagnosis not present

## 2024-02-24 DIAGNOSIS — R931 Abnormal findings on diagnostic imaging of heart and coronary circulation: Secondary | ICD-10-CM | POA: Insufficient documentation

## 2024-02-24 DIAGNOSIS — E782 Mixed hyperlipidemia: Secondary | ICD-10-CM | POA: Diagnosis not present

## 2024-02-24 MED ORDER — TECHNETIUM TC 99M TETROFOSMIN IV KIT
32.4000 | PACK | Freq: Once | INTRAVENOUS | Status: AC | PRN
Start: 2024-02-24 — End: 2024-02-24
  Administered 2024-02-24: 32.4 via INTRAVENOUS

## 2024-02-24 MED ORDER — REGADENOSON 0.4 MG/5ML IV SOLN
0.4000 mg | Freq: Once | INTRAVENOUS | Status: AC
Start: 2024-02-24 — End: 2024-02-24
  Administered 2024-02-24: 0.4 mg via INTRAVENOUS

## 2024-02-25 ENCOUNTER — Encounter: Payer: Self-pay | Admitting: *Deleted

## 2024-02-25 ENCOUNTER — Ambulatory Visit (HOSPITAL_COMMUNITY): Payer: Self-pay | Attending: Cardiovascular Disease

## 2024-02-25 LAB — MYOCARDIAL PERFUSION IMAGING
LV dias vol: 103 mL (ref 62–150)
LV sys vol: 45 mL
Nuc Stress EF: 56 %
Peak HR: 90 {beats}/min
Rest HR: 64 {beats}/min
Rest Nuclear Isotope Dose: 31.6 mCi
SDS: 0
SRS: 0
SSS: 0
ST Depression (mm): 0 mm
Stress Nuclear Isotope Dose: 32.4 mCi
TID: 0.8

## 2024-02-25 MED ORDER — TECHNETIUM TC 99M TETROFOSMIN IV KIT
31.6000 | PACK | Freq: Once | INTRAVENOUS | Status: AC | PRN
  Administered 2024-02-25: 31.6 via INTRAVENOUS

## 2024-03-23 ENCOUNTER — Other Ambulatory Visit (HOSPITAL_COMMUNITY): Payer: Self-pay

## 2024-04-24 ENCOUNTER — Encounter (HOSPITAL_BASED_OUTPATIENT_CLINIC_OR_DEPARTMENT_OTHER): Payer: Self-pay | Admitting: Pharmacist Clinician (PhC)/ Clinical Pharmacy Specialist

## 2024-05-02 ENCOUNTER — Other Ambulatory Visit (HOSPITAL_BASED_OUTPATIENT_CLINIC_OR_DEPARTMENT_OTHER): Payer: Self-pay

## 2024-05-02 ENCOUNTER — Other Ambulatory Visit (HOSPITAL_COMMUNITY): Payer: Self-pay

## 2024-05-04 ENCOUNTER — Encounter: Payer: Self-pay | Admitting: *Deleted

## 2024-05-05 ENCOUNTER — Telehealth: Payer: Commercial Managed Care - PPO | Admitting: Adult Health

## 2024-05-05 DIAGNOSIS — G4733 Obstructive sleep apnea (adult) (pediatric): Secondary | ICD-10-CM

## 2024-05-05 NOTE — Progress Notes (Signed)
 PATIENT: Jake Martin DOB: [birth year]  REASON FOR VISIT: follow up HISTORY FROM: patient PRIMARY NEUROLOGIST:   Virtual Visit via Video Note  I connected with Jake Martin on 05/05/24 at 11:45 AM EDT by a video enabled telemedicine application located remotely at home office and verified that I am speaking with the correct person using two identifiers who was located at their own home in [place].   I discussed the limitations of evaluation and management by telemedicine and the availability of in person appointments. The patient expressed understanding and agreed to proceed.   PATIENT: Jake Martin DOB: Nov 08, 1961  REASON FOR VISIT: follow up HISTORY FROM: patient  HISTORY OF PRESENT ILLNESS: Today 05/05/24:  Jake Martin is a 63 y.o. male with a history of OSA on CPAP. Returns today for follow-up. Reports that CPAP is working well. Loves using it. Only new medical history is that he had cardiac testing reports that his calcium  score was high but his stress test was normal.  His CPAP report is below      05/06/23: Jake Martin is a 63 y.o. male with a history of OSA on CPAP. Returns today for follow-up. Reports that CPAP still working well.  He denies any new issues.       REVIEW OF SYSTEMS: Out of a complete 14 system review of symptoms, the patient complains only of the following symptoms, and all other reviewed systems are negative.  ALLERGIES: Allergies  Allergen Reactions   Dust Mite Extract    Lisinopril Swelling    Lips and eyes swell   Molds & Smuts     HOME MEDICATIONS: Outpatient Medications Prior to Visit  Medication Sig Dispense Refill   Albuterol  Sulfate (PROAIR  RESPICLICK) 108 (90 Base) MCG/ACT AEPB Inhale 1 puff by mouth every 4 hours as needed (Patient taking differently: as needed. Hasnt filled yet 11/16/2021) 1 each 2   amLODipine  (NORVASC ) 10 MG tablet Take 1 tablet (10 mg total) by mouth daily. 90 tablet 3    aspirin EC 81 MG tablet Take 81 mg by mouth daily. Swallow whole.     Aspirin-Acetaminophen  500-325 MG PACK Take 1 packet by mouth every 6 (six) hours as needed (headaches).     Evolocumab  (REPATHA  SURECLICK) 140 MG/ML SOAJ Inject 140 mg into the skin every 14 (fourteen) days. 6 mL 3   hydrochlorothiazide  (HYDRODIURIL ) 25 MG tablet Take 1 tablet (25 mg total) by mouth daily. 90 tablet 3   Multiple Vitamins-Minerals (MULTIVITAMIN WITH MINERALS) tablet Take 1 tablet by mouth daily.     No facility-administered medications prior to visit.    PAST MEDICAL HISTORY: Past Medical History:  Diagnosis Date   Asthma    Last asthma attack about a year ago 11/16/2021   COVID 11/01/2021   cough and congestion, current cough 11/16/2022   Dyspnea    Headache    Hernia    umbilical hernia   Hyperlipidemia    Hypertension    Obesity    OSA on CPAP    Pneumonia    Wears glasses     PAST SURGICAL HISTORY: Past Surgical History:  Procedure Laterality Date   APPENDECTOMY  2005   EYE SURGERY  1967-1968   [facility]   HERNIA REPAIR  2004   LAPAROSCOPIC ASSISTED VENTRAL HERNIA REPAIR Bilateral 01/07/2021   Procedure: diagnostic laparoscopy open repair recurrent incarcerated ventral hernia;  Surgeon: Aldean Hummingbird, MD;  Location: [facility];  Service: General;  Laterality: Bilateral;  WISDOM TOOTH EXTRACTION     WRIST ARTHROSCOPY Right 11/23/2021   Procedure: right wrist scapholunate ligament repair possible reconstruction;  Surgeon: Ltanya Rummer, MD;  Location: [facility];  Service: Orthopedics;  Laterality: Right;    FAMILY HISTORY: Family History  Problem Relation Age of Onset   Hypertension Sister    COPD Father    Hypertension Father    Aneurysm Mother        BRAIN   Diabetes Other    Thyroid disease Other    Diabetes type II Other        GRANDFATHER   Coronary artery disease Other        GRANDMOTHER   Diabetes Paternal Grandmother    Diabetes Son     SOCIAL  HISTORY: Social History   Socioeconomic History   Marital status: Married    Spouse name: Brian Campanile   Number of children: 2   Years of education: 15   Highest education level: Not on file  Occupational History    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  Tobacco Use   Smoking status: Never   Smokeless tobacco: Current    Types: Chew   Tobacco comments:    Chews daily  Vaping Use   Vaping status: Never Used  Substance and Sexual Activity   Alcohol  use: Yes    Alcohol /week: 2.0 standard drinks of alcohol     Types: 2 Standard drinks or equivalent per week    Comment: wine 3-5 glasses weekly   Drug use: No   Sexual activity: Not on file  Other Topics Concern   Not on file  Social History Narrative   Right handed and consumes 3-5 cups of caffeine daily   Social Drivers of Corporate investment banker Strain: Not on file  Food Insecurity: Not on file  Transportation Needs: Not on file  Physical Activity: Not on file  Stress: Not on file  Social Connections: Not on file  Intimate Partner Violence: Not on file      PHYSICAL EXAM Generalized: Well developed, in no acute distress   Neurological examination  Mentation: Alert oriented to time, place, history taking. Follows all commands speech and language fluent Cranial nerve II-XII: Facial symmetry noted DIAGNOSTIC DATA (LABS, IMAGING, TESTING) - I reviewed patient records, labs, notes, testing and imaging myself where available.  Lab Results  Component Value Date   WBC 10.3 01/08/2021   HGB 17.0 11/23/2021   HCT 50.0 11/23/2021   MCV 87.9 01/08/2021   PLT 180 01/08/2021      Component Value Date/Time   NA 139 11/23/2021 0701   K 3.7 11/23/2021 0701   CL 103 11/23/2021 0701   CO2 28 11/23/2021 0701   GLUCOSE 109 (H) 11/23/2021 0701   BUN 17 11/23/2021 0701   CREATININE 0.79 11/23/2021 0701   CALCIUM  9.3 11/23/2021 0701   PROT 8.0 01/07/2021 0934   ALBUMIN 4.3 01/07/2021 0934   AST 23 01/07/2021 0934   ALT 20 01/07/2021  0934   ALKPHOS 50 01/07/2021 0934   BILITOT 1.2 01/07/2021 0934   GFRNONAA >60 11/23/2021 0701    ASSESSMENT AND PLAN 63 y.o. year old male  has a past medical history of Asthma, COVID (11/01/2021), Dyspnea, Headache, Hernia, Hyperlipidemia, Hypertension, Obesity, OSA on CPAP, Pneumonia, and Wears glasses. here with:  OSA on CPAP  CPAP compliance excellent Residual AHI is good Encouraged patient to continue using CPAP nightly and > 4 hours each night F/U in 1 year or sooner if  needed   Clem Currier, MSN, NP-C 05/05/2024, 11:46 AM [facility] [address] [place], [place] [address] [phone] The patient's condition requires frequent monitoring and adjustments in the treatment plan, reflecting the ongoing complexity of care.  This provider is the continuing focal point for all needed services for this condition.

## 2024-05-05 NOTE — Patient Instructions (Signed)
 Continue using CPAP nightly and greater than 4 hours each night If your symptoms worsen or you develop new symptoms please let us know.

## 2024-07-30 ENCOUNTER — Encounter: Payer: Self-pay | Admitting: Cardiovascular Disease

## 2024-10-21 ENCOUNTER — Ambulatory Visit: Payer: Self-pay | Admitting: Cardiovascular Disease

## 2025-05-04 ENCOUNTER — Telehealth: Admitting: Adult Health
